# Patient Record
Sex: Female | Born: 1945
Health system: Southern US, Community
[De-identification: ages and names within clinical notes are randomized; demographics above are authoritative.]

## PROBLEM LIST (undated history)

## (undated) DIAGNOSIS — C539 Malignant neoplasm of cervix uteri, unspecified: Secondary | ICD-10-CM

## (undated) DIAGNOSIS — I10 Essential (primary) hypertension: Secondary | ICD-10-CM

## (undated) DIAGNOSIS — K219 Gastro-esophageal reflux disease without esophagitis: Secondary | ICD-10-CM

## (undated) DIAGNOSIS — H269 Unspecified cataract: Secondary | ICD-10-CM

## (undated) DIAGNOSIS — M779 Enthesopathy, unspecified: Secondary | ICD-10-CM

## (undated) DIAGNOSIS — N950 Postmenopausal bleeding: Secondary | ICD-10-CM

## (undated) HISTORY — DX: Enthesopathy, unspecified: M77.9

## (undated) HISTORY — DX: Malignant neoplasm of cervix uteri, unspecified: C53.9

## (undated) HISTORY — PX: EYE SURGERY: SHX253

## (undated) HISTORY — DX: Unspecified cataract: H26.9

---

## 1997-10-29 ENCOUNTER — Ambulatory Visit (HOSPITAL_COMMUNITY): Admission: RE | Admit: 1997-10-29 | Discharge: 1997-10-29 | Payer: Self-pay | Admitting: Obstetrics

## 1997-10-29 ENCOUNTER — Other Ambulatory Visit: Admission: RE | Admit: 1997-10-29 | Discharge: 1997-10-29 | Payer: Self-pay | Admitting: Obstetrics

## 1998-10-31 ENCOUNTER — Other Ambulatory Visit: Admission: RE | Admit: 1998-10-31 | Discharge: 1998-10-31 | Payer: Self-pay | Admitting: Obstetrics

## 1998-12-10 ENCOUNTER — Encounter: Payer: Self-pay | Admitting: Obstetrics

## 1998-12-10 ENCOUNTER — Ambulatory Visit (HOSPITAL_COMMUNITY): Admission: RE | Admit: 1998-12-10 | Discharge: 1998-12-10 | Payer: Self-pay | Admitting: Obstetrics

## 1998-12-18 ENCOUNTER — Ambulatory Visit (HOSPITAL_COMMUNITY): Admission: RE | Admit: 1998-12-18 | Discharge: 1998-12-18 | Payer: Self-pay | Admitting: Obstetrics

## 1998-12-18 ENCOUNTER — Encounter: Payer: Self-pay | Admitting: Obstetrics

## 1999-06-12 ENCOUNTER — Ambulatory Visit (HOSPITAL_COMMUNITY): Admission: RE | Admit: 1999-06-12 | Discharge: 1999-06-12 | Payer: Self-pay | Admitting: Obstetrics

## 1999-06-12 ENCOUNTER — Encounter: Payer: Self-pay | Admitting: Obstetrics

## 1999-12-01 ENCOUNTER — Other Ambulatory Visit: Admission: RE | Admit: 1999-12-01 | Discharge: 1999-12-01 | Payer: Self-pay | Admitting: Obstetrics

## 1999-12-04 ENCOUNTER — Encounter: Admission: RE | Admit: 1999-12-04 | Discharge: 1999-12-04 | Payer: Self-pay | Admitting: Obstetrics

## 1999-12-04 ENCOUNTER — Encounter: Payer: Self-pay | Admitting: Obstetrics

## 2001-06-02 ENCOUNTER — Encounter: Payer: Self-pay | Admitting: Obstetrics

## 2001-06-02 ENCOUNTER — Ambulatory Visit (HOSPITAL_COMMUNITY): Admission: RE | Admit: 2001-06-02 | Discharge: 2001-06-02 | Payer: Self-pay | Admitting: Obstetrics

## 2002-06-08 ENCOUNTER — Encounter: Payer: Self-pay | Admitting: Obstetrics

## 2002-06-08 ENCOUNTER — Ambulatory Visit (HOSPITAL_COMMUNITY): Admission: RE | Admit: 2002-06-08 | Discharge: 2002-06-08 | Payer: Self-pay | Admitting: Obstetrics

## 2003-06-13 ENCOUNTER — Ambulatory Visit (HOSPITAL_COMMUNITY): Admission: RE | Admit: 2003-06-13 | Discharge: 2003-06-13 | Payer: Self-pay | Admitting: Obstetrics

## 2004-06-24 ENCOUNTER — Ambulatory Visit (HOSPITAL_COMMUNITY): Admission: RE | Admit: 2004-06-24 | Discharge: 2004-06-24 | Payer: Self-pay | Admitting: Obstetrics

## 2004-08-03 HISTORY — PX: OTHER SURGICAL HISTORY: SHX169

## 2005-07-10 ENCOUNTER — Ambulatory Visit (HOSPITAL_COMMUNITY): Admission: RE | Admit: 2005-07-10 | Discharge: 2005-07-10 | Payer: Self-pay | Admitting: Obstetrics

## 2005-08-03 HISTORY — PX: COLONOSCOPY: SHX174

## 2006-05-04 ENCOUNTER — Encounter: Payer: Self-pay | Admitting: Internal Medicine

## 2006-07-12 ENCOUNTER — Ambulatory Visit (HOSPITAL_COMMUNITY): Admission: RE | Admit: 2006-07-12 | Discharge: 2006-07-12 | Payer: Self-pay | Admitting: Obstetrics

## 2006-07-23 ENCOUNTER — Encounter: Admission: RE | Admit: 2006-07-23 | Discharge: 2006-07-23 | Payer: Self-pay | Admitting: Obstetrics

## 2006-10-27 ENCOUNTER — Emergency Department (HOSPITAL_COMMUNITY): Admission: EM | Admit: 2006-10-27 | Discharge: 2006-10-27 | Payer: Self-pay | Admitting: *Deleted

## 2006-12-08 ENCOUNTER — Ambulatory Visit: Payer: Self-pay | Admitting: Internal Medicine

## 2007-07-04 ENCOUNTER — Ambulatory Visit: Payer: Self-pay | Admitting: Internal Medicine

## 2007-07-04 DIAGNOSIS — M898X9 Other specified disorders of bone, unspecified site: Secondary | ICD-10-CM | POA: Insufficient documentation

## 2007-07-04 DIAGNOSIS — I10 Essential (primary) hypertension: Secondary | ICD-10-CM | POA: Insufficient documentation

## 2007-07-04 DIAGNOSIS — R42 Dizziness and giddiness: Secondary | ICD-10-CM | POA: Insufficient documentation

## 2007-07-04 LAB — CONVERTED CEMR LAB
BUN: 8 mg/dL (ref 6–23)
CO2: 31 meq/L (ref 19–32)
Calcium: 9.5 mg/dL (ref 8.4–10.5)
GFR calc Af Amer: 82 mL/min
GFR calc non Af Amer: 68 mL/min
Glucose, Bld: 102 mg/dL — ABNORMAL HIGH (ref 70–99)
Potassium: 4.8 meq/L (ref 3.5–5.1)

## 2007-07-05 ENCOUNTER — Encounter: Payer: Self-pay | Admitting: Internal Medicine

## 2007-07-26 ENCOUNTER — Ambulatory Visit (HOSPITAL_COMMUNITY): Admission: RE | Admit: 2007-07-26 | Discharge: 2007-07-26 | Payer: Self-pay | Admitting: Internal Medicine

## 2007-07-26 ENCOUNTER — Encounter: Payer: Self-pay | Admitting: Internal Medicine

## 2008-02-14 ENCOUNTER — Ambulatory Visit: Payer: Self-pay | Admitting: Internal Medicine

## 2008-02-14 LAB — CONVERTED CEMR LAB
BUN: 16 mg/dL (ref 6–23)
Calcium: 9.7 mg/dL (ref 8.4–10.5)
GFR calc Af Amer: 82 mL/min
GFR calc non Af Amer: 68 mL/min
Glucose, Bld: 101 mg/dL — ABNORMAL HIGH (ref 70–99)
Sodium: 139 meq/L (ref 135–145)

## 2008-02-19 ENCOUNTER — Encounter: Payer: Self-pay | Admitting: Internal Medicine

## 2008-07-31 ENCOUNTER — Ambulatory Visit (HOSPITAL_COMMUNITY): Admission: RE | Admit: 2008-07-31 | Discharge: 2008-07-31 | Payer: Self-pay | Admitting: Internal Medicine

## 2009-04-29 ENCOUNTER — Ambulatory Visit: Payer: Self-pay | Admitting: Internal Medicine

## 2009-04-29 LAB — CONVERTED CEMR LAB
Basophils Relative: 0.3 % (ref 0.0–3.0)
CO2: 31 meq/L (ref 19–32)
Chloride: 105 meq/L (ref 96–112)
Eosinophils Relative: 1.6 % (ref 0.0–5.0)
HCT: 43.9 % (ref 36.0–46.0)
Hemoglobin: 14.2 g/dL (ref 12.0–15.0)
LDL Cholesterol: 110 mg/dL — ABNORMAL HIGH (ref 0–99)
Lymphs Abs: 2.9 10*3/uL (ref 0.7–4.0)
MCV: 94.2 fL (ref 78.0–100.0)
Monocytes Absolute: 0.7 10*3/uL (ref 0.1–1.0)
Neutro Abs: 1.4 10*3/uL (ref 1.4–7.7)
Neutrophils Relative %: 27.9 % — ABNORMAL LOW (ref 43.0–77.0)
Potassium: 4.9 meq/L (ref 3.5–5.1)
RBC: 4.66 M/uL (ref 3.87–5.11)
Total CHOL/HDL Ratio: 3
WBC: 5.1 10*3/uL (ref 4.5–10.5)

## 2009-08-01 ENCOUNTER — Ambulatory Visit (HOSPITAL_COMMUNITY): Admission: RE | Admit: 2009-08-01 | Discharge: 2009-08-01 | Payer: Self-pay | Admitting: Internal Medicine

## 2010-05-07 ENCOUNTER — Ambulatory Visit: Payer: Self-pay | Admitting: Internal Medicine

## 2010-05-07 ENCOUNTER — Encounter: Payer: Self-pay | Admitting: Internal Medicine

## 2010-05-07 LAB — CONVERTED CEMR LAB
BUN: 11 mg/dL (ref 6–23)
CO2: 31 meq/L (ref 19–32)
Calcium: 10.2 mg/dL (ref 8.4–10.5)
Glucose, Bld: 116 mg/dL — ABNORMAL HIGH (ref 70–99)
Sodium: 142 meq/L (ref 135–145)

## 2010-08-05 ENCOUNTER — Ambulatory Visit (HOSPITAL_COMMUNITY)
Admission: RE | Admit: 2010-08-05 | Discharge: 2010-08-05 | Payer: Self-pay | Source: Home / Self Care | Attending: Internal Medicine | Admitting: Internal Medicine

## 2010-08-24 ENCOUNTER — Encounter: Payer: Self-pay | Admitting: Obstetrics

## 2010-09-04 NOTE — Procedures (Signed)
Summary: Colonoscopy/[facility]  Colonoscopy/[facility]   Imported By: Sherian Rein 05/08/2010 11:37:48  _____________________________________________________________________  External Attachment:    Type:   Image     Comment:   External Document

## 2010-09-04 NOTE — Assessment & Plan Note (Signed)
Summary: FU--STC   Vital Signs:  Patient profile:   65 year old female Height:      65 inches Weight:      204 pounds BMI:     34.07 O2 Sat:      98 % on Room air Temp:     97.6 degrees F oral Pulse rate:   64 / minute BP sitting:   132 / 82  (left arm)  Vitals Entered By: Bill Salinas CMA (May 07, 2010 11:05 AM)  O2 Flow:  Room air CC: cpx/ab   Primary Care Provider:  Norins  CC:  cpx/ab.  History of Present Illness: Patient presents for routine medical exam. She has medically, no surgeries and no injury. She does report that things have been difficult but doesn't elaborate. In regard to her health she has been doing well.  Current Medications (verified): 1)  Hydrochlorothiazide 12.5 Mg  Tabs (Hydrochlorothiazide) .... Once Daily 2)  Multivitamins   Tabs (Multiple Vitamin) .... Once Daily 3)  Fish Oil 1000 Mg Caps (Omega-3 Fatty Acids) .Marland Kitchen.. 1 Capsule Once Daily 4)  Advil 200 Mg Tabs (Ibuprofen) .Marland Kitchen.. 1 Tablet Once Daily As Needed  Allergies (verified): 1)  Codeine  Past History:  Past Medical History: Last updated: 07-30-2007 Hypertension  Family History: Last updated: 30-Jul-2007 father- died 25, lung cancer mother - 71 (38), CAD, PVD, HTN, Lipid 3 Brother- HTN in one, 3 sisters - healthy Neg-breast cancer, DM Maternal uncles with colon cancer  Social History: Last updated: Jul 30, 2007 [employer]. married 1987, 1 yr-divorced;remarried 2003 1 son 74 work: Medical illustrator.  Past Surgical History: BONE SPUR (ICD-726.91)-left foot  '06  G1P1  Review of Systems       The patient complains of weight loss.  The patient denies fever, weight gain, decreased hearing, hoarseness, chest pain, syncope, dyspnea on exertion, peripheral edema, prolonged cough, headaches, hemoptysis, abdominal pain, severe indigestion/heartburn, incontinence, suspicious skin lesions, transient blindness, difficulty walking, depression, unusual weight change,  abnormal bleeding, enlarged lymph nodes, and angioedema.    Physical Exam  General:  Heavyset AA woman in no distress. Head:  Normocephalic and atraumatic without obvious abnormalities. No apparent alopecia or balding. Eyes:  vision grossly intact, pupils equal, pupils round, corneas and lenses clear, no iris abnormalities, no optic disk abnormalities, and no retinal abnormalitiies.   Ears:  External ear exam shows no significant lesions or deformities.  Otoscopic examination reveals clear canals, tympanic membranes are intact bilaterally without bulging, retraction, inflammation or discharge. Hearing is grossly normal bilaterally. Nose:  no external deformity and no external erythema.   Mouth:  missing teeth. no buccal or palatal lesions, throat clear Neck:  supple, no masses, no thyromegaly, and no carotid bruits.   Chest Wall:  No deformities, masses, or tenderness noted. Breasts:  deferred to gyn Lungs:  Normal respiratory effort, chest expands symmetrically. Lungs are clear to auscultation, no crackles or wheezes. Heart:  Normal rate and regular rhythm. S1 and S2 normal without gallop, murmur, click, rub or other extra sounds. Abdomen:  soft, non-tender, normal bowel sounds, no guarding, and no hepatomegaly.   Genitalia:  deferred to gyn Msk:  normal ROM, no joint tenderness, no joint swelling, and no redness over joints.   Pulses:  2+ radial and DP pulses Extremities:  No clubbing, cyanosis, edema, or deformity noted with normal full range of motion of all joints.   Neurologic:  alert & oriented X3, cranial nerves II-XII intact, strength normal in all extremities, gait normal,  and DTRs symmetrical and normal.   Skin:  turgor normal, color normal, and no suspicious lesions.   Cervical Nodes:  no anterior cervical adenopathy and no posterior cervical adenopathy.   Psych:  Oriented X3, normally interactive, and good eye contact.     Impression & Recommendations:  Problem # 1:   HYPERTENSION (ICD-401.9)  Her updated medication list for this problem includes:    Hydrochlorothiazide 12.5 Mg Tabs (Hydrochlorothiazide) ..... Once daily  Orders: TLB-BMP (Basic Metabolic Panel-BMET) (80048-METABOL)  BP today: 132/82 Prior BP: 138/84 (04/29/2009)  Good control on diuretic alone.  Plan - continue present medication. Bmet  Problem # 2:  Preventive Health Care (ICD-V70.0) Unremarkable medical historyr in interval since last visit. Limited physical exam is normal and she reports normal gyn exam. Reviewed previous lipid panel which was normal and does not need to be repeated. Current metabolic panel normal except for borderline elevation in blood sugar at 166 (but non-fasting state). 12 Lead EKG reveals normal sinus bradycardia She is current with mammography; last colonoscopy Oct. '07. Patient reports she has had tetnus immunization in the last 10 years and she takes the flu shot at work. She is offered but declines pneumonia vaccine or shingles vaccine.   In summary - a healthy appearing woman. She will return in 1 year or as needed.   Complete Medication List: 1)  Hydrochlorothiazide 12.5 Mg Tabs (Hydrochlorothiazide) .... Once daily 2)  Multivitamins Tabs (Multiple vitamin) .... Once daily 3)  Fish Oil 1000 Mg Caps (Omega-3 fatty acids) .Marland Kitchen.. 1 capsule once daily 4)  Advil 200 Mg Tabs (Ibuprofen) .Marland Kitchen.. 1 tablet once daily as needed  Other Orders: EKG w/ Interpretation (93000)  Tests: (1) BMP (METABOL)   Sodium                    142 mEq/L                   135-145   Potassium            [H]  5.8 mEq/L                   3.5-5.1   Chloride                  106 mEq/L                   96-112   Carbon Dioxide            31 mEq/L                    19-32   Glucose              [H]  116 mg/dL                   16-10   BUN                       11 mg/dL                    9-60   Creatinine                0.9 mg/dL                   4.5-4.0   Calcium                    10.2 mg/dL  8.4-10.5   GFR                       81.22 mL/min                >60Prescriptions: HYDROCHLOROTHIAZIDE 12.5 MG  TABS (HYDROCHLOROTHIAZIDE) once daily  #100 x 3   Entered and Authorized by:   Jacques Navy MD   Signed by:   Jacques Navy MD on 05/07/2010   Method used:   Electronically to        [facility] (retail)       [address]       [place], [place]  [address]       Ph: [phone]       Fax: [phone]   RxID:   [id number]    Preventive Care Screening  Colonoscopy:    Date:  05/04/2006    Next Due:  05/2016    Results:  normal

## 2010-12-19 NOTE — Assessment & Plan Note (Signed)
[facility]                           PRIMARY CARE OFFICE NOTE   NAME:[name], [name]                        MRN:          [id number]  DATE:12/08/2006                            DOB:          08-Aug-1945    Ms. [name] is a 65 year old African American who was referred by her  gynecologist, Dr. Gaynell Face, to establish for ongoing continuity care  with a medical physician.   The patient reports that she was seen in the [facility] emergency  department on 10/27/2006 for hematochezia. She denied having eaten beets  the night before. She was see by Dr. Faylene Kurtz. In reviewing EDMD  dictated report, the physical exam was unremarkable. The rectal exam  revealed no stool in the rectum, guag negative, red fluid on the exam  glove that was guag negative. The patient did have a laboratory  revealing a hemoglobin of 14 grams. Her white count was 3500 with a  normal differential. The patient was noted to be hypertensive at the  time of that exam and was started on hydrochlorothiazide 25 mg daily.  She has had no recurrent problems.   PAST MEDICAL HISTORY:   SURGICAL:  Operation for a bone spur in 2006. No other surgeries.   MEDICAL:  1. The patient had the usual childhood diseases.  2. Hypertensive blood pressure readings. No other medical problems      noted.   GYNECOLOGIC:  Gravida 1, para 1.   CURRENT MEDICATIONS:  None, having run out of hydrochlorothiazide 25 mg  several weeks ago. She does take Centrum silver.   FAMILY HISTORY:  Maternal uncle with colon cancer. Her father was a  smoker and died of lung cancer. Mother had hypertension. Grandmother had  diabetes. No history of breast cancer in the family. Cardiac disease,  hyperlipidemia.   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 The patient was married for one  year and then separated. She was single for 15 years and has  been  married for 4 years to her present husband. She has one son aged 101. She  has no grandchildren. She and her husband live alone and independently.  She enjoys her work.   HABITS:  The patient does not use tobacco products. She does use  alcohol, averaging 1 or 2 beers per month.   HEALTH MAINTENANCE:  The patient's last pelvic and pap smear dated July  2007. Her last mammogram was in 2007.   REVIEW OF SYSTEMS:  The patient had no constitutional problems except  for a mild weight gain. She had her last eye exam in 2007. The patient  does report that she does have an infected tooth. She does have  partials, upper and lower. There are no cardiovascular or respiratory  complaints. She has rare heartburn and does not require medication. She  reports that she had a colonoscopy by Dr. Sharon Mt, who I believe is  with [facility]. She had no GU, musculoskeletal, or dermatologic complaints.   LIMITED EXAM:  VITAL SIGNS:  Temperature 97.3, blood pressure 138/92,  pulse 64, weight 190.  GENERAL:  This is a well developed, well nourished woman in no acute  distress.  HEENT:  Normocephalic atraumatic. External auditory canals and tympanic  membranes were normal. Oral pharynx revealed the patient to have partial  dentures in place. No buckle or pallow lesions were noted. Posterior  pharynx was clear. Conjunctiva and sclerae was clear. Pupils equal,  round, and reactive to light and accommodation.  FUNDUSCOPIC:  Revealed normal disc margins. No vascular abnormalities  were noted.  NECK:  Supple. There was no thyromegaly. No adenopathy was noted in the  cervical supraclavicular regions.  CHEST:  No CVA tenderness.  LUNGS:  Clear to auscultation and percussion.  CARDIOVASCULAR:  2+ radial pulse. No JVD or carotid bruits. She had a  quiet precordium with a regular rate and rhythm without murmurs, rubs,  or gallops.  ABDOMEN:  Soft. No guarding or rebound. No organo splenomegaly was  noted.   PELVIC:  Referred to Dr. Gaynell Face.   The patient did have recent CBC at [facility] emergency department as  noted. The patient's last set of full labs dates from June 2006 where  she had normal electrolytes. Her blood sugar was normal at 87. Lipid  profile revealed a cholesterol of 184. Triglycerides 45. HDL was  excellent at 68. LDL was normal at 107.   ASSESSMENT AND PLAN:  1. Hypertension. The patient with borderline blood pressure at this      time. She reports that on hydrochlorothiazide her pressure was in      the 120/80 range. PLAN:  The patient is to continue      hydrochlorothiazide at a reduced dose of 12.5 mg daily.      Prescription for 100 25 mg tablets provided. The patient was      advised to pay cash.  2. Health maintenance. The patient is current with mammography. She is      current with colonoscopy and she does see Dr. Gaynell Face on a regular      basis.   In summary, this is a very pleasant patient now established for ongoing  continuity care. I have asked her to return to see me in six months for  follow up of her blood pressure or sooner on an as needed basis.     Rosalyn Gess Norins, MD  Electronically Signed    MEN/MedQ  DD: 12/08/2006  DT: 12/09/2006  Job #: [id number]   cc:   Bess Harvest, M.D.

## 2011-02-11 ENCOUNTER — Other Ambulatory Visit: Payer: Self-pay

## 2011-02-11 ENCOUNTER — Encounter (HOSPITAL_COMMUNITY): Payer: Self-pay

## 2011-02-11 ENCOUNTER — Encounter (HOSPITAL_COMMUNITY)
Admission: RE | Admit: 2011-02-11 | Discharge: 2011-02-11 | Disposition: A | Discharge status: Home / Self Care | Payer: BC Managed Care – PPO | Source: Ambulatory Visit | Attending: Obstetrics | Admitting: Obstetrics

## 2011-02-11 ENCOUNTER — Other Ambulatory Visit (HOSPITAL_COMMUNITY): Payer: Self-pay

## 2011-02-11 HISTORY — DX: Gastro-esophageal reflux disease without esophagitis: K21.9

## 2011-02-11 HISTORY — DX: Essential (primary) hypertension: I10

## 2011-02-11 LAB — CBC
Hemoglobin: 14.4 g/dL (ref 12.0–15.0)
MCH: 30.2 pg (ref 26.0–34.0)
MCHC: 33.5 g/dL (ref 30.0–36.0)

## 2011-02-11 LAB — BASIC METABOLIC PANEL
BUN: 11 mg/dL (ref 6–23)
Calcium: 9.9 mg/dL (ref 8.4–10.5)
GFR calc non Af Amer: >60 mL/min (ref >60)
Glucose, Bld: 97 mg/dL (ref 70–99)
Sodium: 136 mEq/L (ref 135–145)

## 2011-02-11 NOTE — Anesthesia Preprocedure Evaluation (Addendum)
Anesthesia Evaluation  Name, MR# and DOB Patient awake  General Assessment Comment  Reviewed: Allergy & Precautions, H&P  and Patient's Chart, lab work & pertinent test results  Airway Mallampati: III TM Distance: >3 FB Neck ROM: Full    Dental No notable dental hx (+) Lower Dentures and Upper Dentures   Pulmonaryneg pulmonary ROS      pulmonary exam normal   Cardiovascular Regular Normal   Neuro/PsychNegative Neurological ROS   GI/Hepatic/Renal negative GI ROS, negative Liver ROS, and negative Renal ROS (+)       Endo/Other  Negative Endocrine ROS (+)  Morbid obesity Abdominal Normal abdominal exam  (+)   Musculoskeletal negative musculoskeletal ROS (+)  Hematology negative hematology ROS (+)   Peds  Reproductive/Obstetrics   Anesthesia Other Findings             Anesthesia Physical Anesthesia Plan  ASA: III  Anesthesia Plan: General   Post-op Pain Management:    Induction: Intravenous  Airway Management Planned: LMA  Additional Equipment:   Intra-op Plan:   Post-operative Plan:   Informed Consent: I have reviewed the patients History and Physical, chart, labs and discussed the procedure including the risks, benefits and alternatives for the proposed anesthesia with the patient or authorized representative who has indicated his/her understanding and acceptance.     Plan Discussed with: Anesthesiologist (AP)  Anesthesia Plan Comments:        Anesthesia Quick Evaluation

## 2011-02-11 NOTE — Patient Instructions (Addendum)
20 MALENI SEYER  02/11/2011   Your procedure is scheduled on:  02/18/2011   Report to [facility] at 0915 AM.  Call this number if you have problems the morning of surgery: [phone]   Remember:   Do not eat food:After Midnight.  Do not drink clear liquids: After Midnight.  Take these medicines the morning of surgery with A SIP OF WATER: Hydrochlorothiazide   Do not wear jewelry, make-up or nail polish.  Do not bring valuables to the hospital.  Contacts, dentures or bridgework may not be worn into surgery.  Leave suitcase in the car. After surgery it may be brought to your room.  For patients admitted to the hospital, checkout time is 11:00 AM the day of discharge.   Patients discharged the day of surgery will not be allowed to drive home.  Name and phone number of your driver: Fayrene Fearing  Special Instructions: Hibiclens bath the night before your surgery and the morning of your surgery   Please read over the following fact sheets that you were given: Pain Booklet, Surgical Site Infection Prevention and Anesthesia Post-op Instructions

## 2011-02-17 NOTE — H&P (Signed)
NAME:  Payette, Kamaiya                      ACCOUNT NO.:  MEDICAL RECORD NO.:  [id number]  LOCATION:                                 FACILITY:  PHYSICIAN:  Kathreen Cosier, M.D.DATE OF BIRTH:  1945-08-25  DATE OF ADMISSION: DATE OF DISCHARGE:                             HISTORY & PHYSICAL   PREOPERATIVE DIAGNOSIS:  Postmenopausal bleeding.  The patient is a 65 year old gravida 1, para 1-0-0-1.  Her last period was when she was 65 years old.  She had a colonoscopy in 2007, and the patient stated that approximately a week prior to her last visit on January 19, 2011, she had some vaginal bleeding, so she is scheduled for a hysteroscopy D and C.  PAST MEDICAL HISTORY:  Negative.  PAST SURGICAL HISTORY:  Negative.  SOCIAL HISTORY:  Negative.  SYSTEM REVIEW:  Negative.  PHYSICAL EXAMINATION:  GENERAL:  Well-developed female, in no distress. HEENT:  Negative. LUNGS:  Clear. HEART:  Regular rhythm.  No murmurs, no gallops. BREASTS:  No masses. ABDOMEN:  Nontender. PELVIC:  Uterus was small.  Negative adnexa.  Pap smear was done. Vagina and external genitalia normal.          ______________________________ Kathreen Cosier, M.D.     BAM/MEDQ  D:  02/16/2011  T:  02/16/2011  Job:  [id number]

## 2011-02-18 ENCOUNTER — Ambulatory Visit (HOSPITAL_COMMUNITY)
Admission: RE | Admit: 2011-02-18 | Discharge: 2011-02-18 | Disposition: A | Discharge status: Home / Self Care | Payer: BC Managed Care – PPO | Source: Ambulatory Visit | Attending: Obstetrics | Admitting: Obstetrics

## 2011-02-18 ENCOUNTER — Ambulatory Visit (HOSPITAL_COMMUNITY): Payer: BC Managed Care – PPO | Admitting: Anesthesiology

## 2011-02-18 ENCOUNTER — Encounter (HOSPITAL_COMMUNITY): Payer: Self-pay | Admitting: Anesthesiology

## 2011-02-18 ENCOUNTER — Other Ambulatory Visit: Payer: Self-pay | Admitting: Obstetrics

## 2011-02-18 ENCOUNTER — Encounter (HOSPITAL_COMMUNITY): Payer: Self-pay | Admitting: Obstetrics

## 2011-02-18 ENCOUNTER — Encounter (HOSPITAL_COMMUNITY)
Admission: RE | Disposition: A | Discharge status: Home / Self Care | Payer: Self-pay | Source: Ambulatory Visit | Attending: Obstetrics

## 2011-02-18 DIAGNOSIS — Z01818 Encounter for other preprocedural examination: Secondary | ICD-10-CM | POA: Insufficient documentation

## 2011-02-18 DIAGNOSIS — N950 Postmenopausal bleeding: Secondary | ICD-10-CM

## 2011-02-18 DIAGNOSIS — Z01812 Encounter for preprocedural laboratory examination: Secondary | ICD-10-CM | POA: Insufficient documentation

## 2011-02-18 HISTORY — DX: Postmenopausal bleeding: N95.0

## 2011-02-18 HISTORY — PX: HYSTEROSCOPY WITH D & C: SHX1775

## 2011-02-18 SURGERY — DILATATION AND CURETTAGE /HYSTEROSCOPY
Anesthesia: General

## 2011-02-18 MED ORDER — FENTANYL CITRATE 0.05 MG/ML IJ SOLN
INTRAMUSCULAR | Status: DC | PRN
  Administered 2011-02-18: 100 ug via INTRAVENOUS

## 2011-02-18 MED ORDER — LIDOCAINE HCL (CARDIAC) 20 MG/ML IV SOLN
INTRAVENOUS | Status: DC | PRN
  Administered 2011-02-18: 80 mg via INTRAVENOUS

## 2011-02-18 MED ORDER — MEPERIDINE HCL 25 MG/ML IJ SOLN: 6.2500 mg | INTRAMUSCULAR | Status: DC | PRN

## 2011-02-18 MED ORDER — DEXAMETHASONE SODIUM PHOSPHATE 10 MG/ML IJ SOLN
INTRAMUSCULAR | Status: AC
  Filled 2011-02-18: qty 1

## 2011-02-18 MED ORDER — FENTANYL CITRATE 0.05 MG/ML IJ SOLN: 25.0000 ug | INTRAMUSCULAR | Status: DC | PRN

## 2011-02-18 MED ORDER — PROPOFOL 10 MG/ML IV EMUL
INTRAVENOUS | Status: AC
  Filled 2011-02-18: qty 20

## 2011-02-18 MED ORDER — ONDANSETRON HCL 4 MG/2ML IJ SOLN
INTRAMUSCULAR | Status: DC | PRN
  Administered 2011-02-18: 4 mg via INTRAVENOUS

## 2011-02-18 MED ORDER — ONDANSETRON HCL 4 MG/2ML IJ SOLN: 4.0000 mg | Freq: Once | INTRAMUSCULAR | Status: DC | PRN

## 2011-02-18 MED ORDER — DEXAMETHASONE SODIUM PHOSPHATE 4 MG/ML IJ SOLN
INTRAMUSCULAR | Status: DC | PRN
  Administered 2011-02-18: 10 mg via INTRAVENOUS

## 2011-02-18 MED ORDER — ACETAMINOPHEN 325 MG PO TABS: 325.0000 mg | ORAL_TABLET | ORAL | Status: DC | PRN

## 2011-02-18 MED ORDER — LACTATED RINGERS IV SOLN
INTRAVENOUS | Status: DC
  Administered 2011-02-18 (×3): via INTRAVENOUS

## 2011-02-18 MED ORDER — FENTANYL CITRATE 0.05 MG/ML IJ SOLN
INTRAMUSCULAR | Status: AC
  Filled 2011-02-18: qty 2

## 2011-02-18 MED ORDER — LIDOCAINE HCL 1 % IJ SOLN
INTRAMUSCULAR | Status: DC | PRN
  Administered 2011-02-18: 9 mL

## 2011-02-18 MED ORDER — MIDAZOLAM HCL 5 MG/5ML IJ SOLN
INTRAMUSCULAR | Status: DC | PRN
  Administered 2011-02-18: 2 mg via INTRAVENOUS

## 2011-02-18 MED ORDER — MIDAZOLAM HCL 2 MG/2ML IJ SOLN
INTRAMUSCULAR | Status: AC
  Filled 2011-02-18: qty 2

## 2011-02-18 MED ORDER — LIDOCAINE HCL (CARDIAC) 20 MG/ML IV SOLN
INTRAVENOUS | Status: AC
Start: 2011-02-18 — End: 2011-02-18
  Filled 2011-02-18: qty 5

## 2011-02-18 MED ORDER — GLYCINE 1.5 % IR SOLN
Status: DC | PRN
  Administered 2011-02-18: 3000 mL

## 2011-02-18 MED ORDER — PROPOFOL 10 MG/ML IV EMUL
INTRAVENOUS | Status: DC | PRN
  Administered 2011-02-18: 200 mg via INTRAVENOUS

## 2011-02-18 MED ORDER — KETOROLAC TROMETHAMINE 30 MG/ML IJ SOLN
INTRAMUSCULAR | Status: DC | PRN
  Administered 2011-02-18: 30 mg via INTRAVENOUS

## 2011-02-18 MED ORDER — ONDANSETRON HCL 4 MG/2ML IJ SOLN
INTRAMUSCULAR | Status: AC
  Filled 2011-02-18: qty 2

## 2011-02-18 MED ORDER — KETOROLAC TROMETHAMINE 30 MG/ML IJ SOLN
INTRAMUSCULAR | Status: AC
  Filled 2011-02-18: qty 1

## 2011-02-18 SURGICAL SUPPLY — 16 items
CANISTER SUCTION 2500CC (MISCELLANEOUS) ×2 IMPLANT
CATH ROBINSON RED A/P 16FR (CATHETERS) ×2 IMPLANT
CLOTH BEACON ORANGE TIMEOUT ST (SAFETY) ×2 IMPLANT
CONTAINER PREFILL 10% NBF 60ML (FORM) ×4 IMPLANT
DRAPE UTILITY XL STRL (DRAPES) ×2 IMPLANT
ELECT REM PT RETURN 9FT ADLT (ELECTROSURGICAL)
ELECTRODE REM PT RTRN 9FT ADLT (ELECTROSURGICAL) IMPLANT
ELECTRODE ROLLER BARREL 22FR (ELECTROSURGICAL) IMPLANT
ELECTRODE VAPORCUT 22FR (ELECTROSURGICAL) IMPLANT
GLOVE BIO SURGEON STRL SZ8.5 (GLOVE) ×4 IMPLANT
GOWN BRE IMP SLV AUR LG STRL (GOWN DISPOSABLE) ×2 IMPLANT
GOWN STRL REIN 2XL LVL4 (GOWN DISPOSABLE) ×2 IMPLANT
LOOP ANGLED CUTTING 22FR (CUTTING LOOP) IMPLANT
PACK HYSTEROSCOPY LF (CUSTOM PROCEDURE TRAY) ×2 IMPLANT
TOWEL OR 17X24 6PK STRL BLUE (TOWEL DISPOSABLE) ×4 IMPLANT
WATER STERILE IRR 1000ML POUR (IV SOLUTION) ×2 IMPLANT

## 2011-02-18 NOTE — Transfer of Care (Signed)
Immediate Anesthesia Transfer of Care Note  Patient: [name]  Procedure(s) Performed:  DILATATION AND CURETTAGE (D&C) /HYSTEROSCOPY  Patient Location: PACU  Anesthesia Type: General  Level of Consciousness: awake, alert  and oriented  Airway & Oxygen Therapy: Patient Spontanous Breathing and Patient connected to nasal cannula oxygen  Post-op Assessment: Report given to PACU RN and Post -op Vital signs reviewed and stable  Post vital signs: Reviewed and stable  Complications: No apparent anesthesia complications

## 2011-02-18 NOTE — Brief Op Note (Signed)
02/18/2011  11:30 AM  PATIENT:  [name]  65 y.o. female  PRE-OPERATIVE DIAGNOSIS:  post menopausal bleeding  POST-OPERATIVE DIAGNOSIS:  post menopausal bleeding  PROCEDURE:  Procedure(s): DILATATION AND CURETTAGE (D&C) /HYSTEROSCOPY  SURGEON:  Surgeon(s): Kathreen Cosier  PHYSICIAN ASSISTANT:   ASSISTANTS: none   ANESTHESIA:   general  ESTIMATED BLOOD LOSS: * No blood loss amount entered *   BLOOD ADMINISTERED:none  DRAINS: none   LOCAL MEDICATIONS USED:  XYLOCAINE 9 CC  SPECIMEN:  Source of Specimen:  endometrial tissue  DISPOSITION OF SPECIMEN:  PATHOLOGY  COUNTS:  YES  TOURNIQUET:  * No tourniquets in log *  DICTATION #:   PLAN OF CARE: home  PATIENT DISPOSITION:  PACU - hemodynamically stable.   Delay start of Pharmacological VTE agent (>24hrs) due to surgical blood loss or risk of bleeding:  not applicable

## 2011-02-18 NOTE — Op Note (Signed)
This is Dr. Francoise Ceo dictating the operative note on [name] e preop diagnosis postmenopausal bleeding Postop diagnosis is same Anesthesia Gen. Procedure The patient was placed in lithotomy position perineum and vagina prepped and draped Data entered with a straight catheter Bimanual exam revealed that the uterus is normal size negative adnexa Speculum placed in the vagina and the cervix visualized The cervix was injected with 9 cc of 1% Xylocaine The endometrial cavity was sounded 10 cm The anterior lip of the cervix was grasped with a tenaculum The cervix was dilated to #25 [name] The hysteroscope was inserted and the cavity was noted to be normal The hysteroscope was removed the fluid deficit was 90 cc A sharp curettage was performed and a large amount of tissue obtained and sent to pathology Patient tolerated the procedure well taken to the recovery in good condition end of dictation.

## 2011-02-18 NOTE — Anesthesia Postprocedure Evaluation (Signed)
  Anesthesia Post-op Note  Patient: [name]  Procedure(s) Performed:  DILATATION AND CURETTAGE (D&C) /HYSTEROSCOPY  Patient is awake and responsive. Pain and nausea are reasonably well controlled. Vital signs are stable and clinically acceptable. Oxygen saturation is clinically acceptable. There are no apparent anesthetic complications at this time. Patient is ready for discharge.

## 2011-02-23 NOTE — Addendum Note (Signed)
Addendum  created 02/23/11 0851 by Jiles Garter   Modules edited:Anesthesia Events

## 2011-03-10 ENCOUNTER — Encounter (HOSPITAL_COMMUNITY): Payer: Self-pay | Admitting: Obstetrics

## 2011-03-19 ENCOUNTER — Ambulatory Visit: Payer: BC Managed Care – PPO | Attending: Gynecologic Oncology | Admitting: Gynecologic Oncology

## 2011-03-19 DIAGNOSIS — Z801 Family history of malignant neoplasm of trachea, bronchus and lung: Secondary | ICD-10-CM | POA: Insufficient documentation

## 2011-03-19 DIAGNOSIS — Z79899 Other long term (current) drug therapy: Secondary | ICD-10-CM | POA: Insufficient documentation

## 2011-03-19 DIAGNOSIS — I10 Essential (primary) hypertension: Secondary | ICD-10-CM | POA: Insufficient documentation

## 2011-03-19 DIAGNOSIS — C549 Malignant neoplasm of corpus uteri, unspecified: Secondary | ICD-10-CM | POA: Insufficient documentation

## 2011-03-24 NOTE — Consult Note (Signed)
[name], GREENSPAN NO.:  [id number]  MEDICAL RECORD NO.:  [id number]  LOCATION:  GYN                          FACILITY:  [facility]  PHYSICIAN:  Laurette Schimke, MD     DATE OF BIRTH:  07/04/1946  DATE OF CONSULTATION:  03/19/2011 DATE OF DISCHARGE:                                CONSULTATION   REASON FOR EVALUATION:  Endometrial cancer.  HISTORY OF PRESENT ILLNESS:  [name] is a 65 year old gravida 1, para 1, last normal menstrual period in her 59s.  The patient was in her usual state of great health until she noted vaginal bleeding in June, she presented and a D and C was performed by Dr. Gaynell Face on February 18, 2011.  Pathology was notable for an endometrioid adenocarcinoma of grade 1.  PAST MEDICAL HISTORY:  Hypertension for 4 years.  PAST SURGICAL HISTORY:  Left foot bone spur surgery in 2006, D and C in 2012.  MEDICATIONS:  Centrum Silver daily, fish oil daily, Advil p.r.n., and unspecified diuretics 12.5 mg daily.  PAST GYN HISTORY:  Menarche occurred at age of 59 with menses occurring every month until menopause.  She reports diagnosis of leiomyoma premenopausal which were addressed when she became postmenopausal.  She reports OCP use for over 10 years and IUD for a brief period of time.  FAMILY HISTORY:  Father diagnosed with lung cancer at the age of 19, otherwise denies a family history of gynecologic, colon and GYN primary.  SOCIAL HISTORY:  [name] has been married for 9 years.  Her child is alive and well.  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 She denies tobacco use.  Reports occasional alcohol use.  SCREENING HISTORY:  Colonoscopy in 2007, Pap in 2012, and mammogram in January 2012; all within normal limits.  ALLERGIES:  CODEINE which causes a rash.  REVIEW OF SYSTEMS:  No nausea, vomiting, fever, or chills.  No shortness of breath, dyspnea, or chest pain.  No adenopathy.  Denies flank pain. Reports scant vaginal  bleeding since D and C.  No hematochezia or hematuria.  No lower extremity edema.  Otherwise, 10-point review of systems is negative.  PHYSICAL EXAMINATION:  GENERAL:  A well-developed female, in no acute distress. VITAL SIGNS:  Weight 191 pounds, height 5 feet 4 inches, blood pressure 110/68, pulse of 80, temperature 98.0, and BMI 32. LYMPH NODE:  No cervical, supraclavicular, or inguinal adenopathy. CHEST:  Clear to auscultation. HEART:  Regular rate and rhythm. BACK:  No CVA tenderness. ABDOMEN:  Soft, obese, and nontender.  No palpable masses.  No evidence of a fluid wave or hernia. PELVIC:  Normal external genitalia, Bartholin's, urethra, and Skene's. Scant vaginal bleeding.  The uterus mobile, unable to clearly assess size, palpated approximately 11 cm.  No cul-de-sac masses. RECTAL:  Good anal sphincter tone without any rectovaginal septum nodularity. EXTREMITIES:  No clubbing, cyanosis, or edema.  IMPRESSION:  [name] is a 65 year old with a grade 1 endometrial adenocarcinoma.  Options presented to the patient were that a surgical staging versus radiation or IUD use.  The patient opted for definitive treatment.  The plan is for robotic-assisted total  laparoscopic hysterectomy, bilateral salpingo-oophorectomy, and bilateral pelvic lymph node dissection on March 31, 2011.  Risks and benefits of the procedure were discussed with the patient and all of her questions were answered.     Laurette Schimke, MD     WB/MEDQ  D:  03/19/2011  T:  03/20/2011  Job:  [id number]  cc:   Dr.  Dorann Ou, R.N. [address] [place], [place] [address]  Electronically Signed by Laurette Schimke MD on 03/24/2011 07:07:40 AM

## 2011-03-27 ENCOUNTER — Other Ambulatory Visit: Payer: Self-pay | Admitting: Obstetrics & Gynecology

## 2011-03-27 ENCOUNTER — Other Ambulatory Visit: Payer: Self-pay | Admitting: Gynecologic Oncology

## 2011-03-27 ENCOUNTER — Encounter (HOSPITAL_COMMUNITY): Payer: BC Managed Care – PPO

## 2011-03-27 ENCOUNTER — Ambulatory Visit (HOSPITAL_COMMUNITY)
Admission: RE | Admit: 2011-03-27 | Discharge: 2011-03-27 | Disposition: A | Discharge status: Home / Self Care | Payer: BC Managed Care – PPO | Source: Ambulatory Visit | Attending: Obstetrics & Gynecology | Admitting: Obstetrics & Gynecology

## 2011-03-27 DIAGNOSIS — I10 Essential (primary) hypertension: Secondary | ICD-10-CM | POA: Insufficient documentation

## 2011-03-27 DIAGNOSIS — Z01812 Encounter for preprocedural laboratory examination: Secondary | ICD-10-CM | POA: Insufficient documentation

## 2011-03-27 DIAGNOSIS — Z01818 Encounter for other preprocedural examination: Secondary | ICD-10-CM | POA: Insufficient documentation

## 2011-03-27 DIAGNOSIS — Z01811 Encounter for preprocedural respiratory examination: Secondary | ICD-10-CM

## 2011-03-27 LAB — COMPREHENSIVE METABOLIC PANEL
AST: 21 U/L (ref 0–37)
BUN: 13 mg/dL (ref 6–23)
CO2: 29 mEq/L (ref 19–32)
Calcium: 10.1 mg/dL (ref 8.4–10.5)
Creatinine, Ser: 0.87 mg/dL (ref 0.50–1.10)
GFR calc Af Amer: >60 mL/min (ref >60)
GFR calc non Af Amer: >60 mL/min (ref >60)
Total Bilirubin: 0.3 mg/dL (ref 0.3–1.2)

## 2011-03-27 LAB — DIFFERENTIAL
Eosinophils Absolute: 0 10*3/uL (ref 0.0–0.7)
Eosinophils Relative: 1 % (ref 0–5)
Lymphocytes Relative: 55 % — ABNORMAL HIGH (ref 12–46)
Lymphs Abs: 2.7 10*3/uL (ref 0.7–4.0)
Monocytes Relative: 10 % (ref 3–12)
Neutrophils Relative %: 34 % — ABNORMAL LOW (ref 43–77)

## 2011-03-27 LAB — CBC
HCT: 40.4 % (ref 36.0–46.0)
MCH: 30.2 pg (ref 26.0–34.0)
MCV: 89.6 fL (ref 78.0–100.0)
Platelets: 175 10*3/uL (ref 150–400)
RBC: 4.51 MIL/uL (ref 3.87–5.11)

## 2011-03-27 LAB — ABO/RH: ABO/RH(D): A POS

## 2011-03-27 LAB — SURGICAL PCR SCREEN
MRSA, PCR: NEGATIVE
Staphylococcus aureus: NEGATIVE

## 2011-03-31 ENCOUNTER — Other Ambulatory Visit: Payer: Self-pay | Admitting: Gynecologic Oncology

## 2011-03-31 ENCOUNTER — Ambulatory Visit (HOSPITAL_COMMUNITY)
Admission: RE | Admit: 2011-03-31 | Discharge: 2011-04-01 | Disposition: A | Discharge status: Home / Self Care | Payer: BC Managed Care – PPO | Source: Ambulatory Visit | Attending: Obstetrics & Gynecology | Admitting: Obstetrics & Gynecology

## 2011-03-31 DIAGNOSIS — C549 Malignant neoplasm of corpus uteri, unspecified: Secondary | ICD-10-CM | POA: Insufficient documentation

## 2011-03-31 DIAGNOSIS — I10 Essential (primary) hypertension: Secondary | ICD-10-CM | POA: Insufficient documentation

## 2011-03-31 DIAGNOSIS — R7309 Other abnormal glucose: Secondary | ICD-10-CM | POA: Insufficient documentation

## 2011-03-31 HISTORY — PX: ROBOTIC ASSISTED LAP VAGINAL HYSTERECTOMY: SHX2362

## 2011-03-31 LAB — GLUCOSE, CAPILLARY
Glucose-Capillary: 98 mg/dL (ref 70–99)
Glucose-Capillary: 113 mg/dL — ABNORMAL HIGH (ref 70–99)

## 2011-03-31 LAB — TYPE AND SCREEN

## 2011-04-01 LAB — CBC
Platelets: 167 10*3/uL (ref 150–400)
RBC: 4.02 MIL/uL (ref 3.87–5.11)
RDW: 13.5 % (ref 11.5–15.5)
WBC: 6.1 10*3/uL (ref 4.0–10.5)

## 2011-04-03 NOTE — Op Note (Signed)
NAMECARMISHA, [name] NO.:  [id number]  MEDICAL RECORD NO.:  [id number]  LOCATION:  1536                         FACILITY:  [facility]  PHYSICIAN:  Laurette Schimke, MD     DATE OF BIRTH:  [birth year]  DATE OF PROCEDURE:  03/31/2011 DATE OF DISCHARGE:                              OPERATIVE REPORT   PREOPERATIVE DIAGNOSIS:  Grade 1 endometrial cancer.  POSTOPERATIVE DIAGNOSIS:  Stage IA, grade 1 endometrial cancer.  PROCEDURE:  Robotic-assisted laparoscopic hysterectomy, bilateral salpingo-oophorectomy, right pelvic lymph node dissection.  ANESTHESIA:  General endotracheal.  SURGEON:  Laurette Schimke, MD  ASSISTANT:  Roseanna Rainbow, MD; Telford Nab, RN  FINDINGS:  An 8 cm uterus, mobile; left broad ligament leiomyoma; and small cyst on the right ovary.  DESCRIPTION OF PROCEDURE:  The patient was taken to the operating room and placed under general endotracheal anesthesia without any difficulty. She was placed in dorsal lithotomy position and the abdomen and vagina prepped.  Uterine manipulator was inserted and the balloon was placed within the vagina.  An incision was made approximately 2 cm inferior to the left costal margin and under direct visualization, an Optiview scope was inserted into the upper abdominal cavity.  The abdomen was insufflated.  An incision was made just inferior to the umbilicus. Incisions were made 10 cm lateral to the umbilical incision and one 2 cm superior to the left anterior superior iliac spine.  Through these sites, trocars were placed under direct visualization.  The patient was placed in Trendelenburg and the robotic system was docked.  The right ureter was easily visible.  The round ligament was transected, retroperitoneal space entered.  The infundibulopelvic ligament skeletonized, cauterized, and transected.  The broad ligament was dissected free down to the level of the cervix, both anteriorly and posteriorly.   Bladder flap was created and the bladder dissected off the cervix.  The uterine vessels on the right were skeletonized, coagulated, and transected.  In a similar fashion, the left round ligament was transected, retroperitoneal space entered, the ureter identified.  The broad ligament incised.  The left infundibulopelvic vessels skeletonized, cauterized, transected.  The broad ligament similarly was transected anteriorly and posteriorly down to the cervix.  Left uterine vessels were skeletonized, cauterized, and transected.  Of note, throughout the entire procedure, the abdominal pressure did not exceed 15 mm.  In fact, after colpotomy incision, there was difficulty maintaining insufflation.  A colpotomy incision was made and the uterus, cervix, ovaries, and tubes delivered through the vagina.  The vaginal balloon was re-inserted into the vaginal cavity.  The right anterior vesical artery was identified. The vesicovaginal space created, the obturator nerve identified.  Nodal tissue was removed from between the landmarks of the obturator nerve inferiorly, the superior vesical artery medially, the genitofemoral nerve laterally, and the bifurcation of the common iliac artery. Cautery was used to remove the specimens overlying the external iliac artery and vein between the boundaries previously identified.  Frozen section returned with a grade 1 endometrial cancer with minimal myometrial invasion.  As such, there did not appear to be the need to complete the left pelvic lymph node dissection.  The abdomen was  irrigated and drained, hemostasis was assured.  Nodal specimen was placed within a bag and was removed from the left upper quadrant port. The vaginal cuff was closed with 0 Vicryl sutures overlapping in the midline.  The sutures were secured to each other.  Hemostasis was once again assured.  The robot was undocked and instruments removed and abdomen desufflated.  The port sites were  irrigated.  The vagina was swabbed. The umbilical fascia was closed with Vicryl.  All other port sites and subcuticular closures were facilitated with 4-0 Vicryl.  Steri- Strips were placed over the incision.  Sponge and needle count correct x3.  DRAINS:  Foley draining clear urine.  PATHOLOGY:  Grade 1 endometrial cancer with minimal invasion.  DISPOSITION:  The patient was extubated and taken to recovery room in good condition.     Laurette Schimke, MD     WB/MEDQ  D:  03/31/2011  T:  04/01/2011  Job:  [id number]  cc:   Roseanna Rainbow, M.D. Fax: [phone]  Telford Nab, R.N. [address] [place], [place] [address]  Kathreen Cosier, M.D. Fax: [phone]  Electronically Signed by Laurette Schimke MD on 04/03/2011 08:45:58 AM

## 2011-05-08 ENCOUNTER — Telehealth: Payer: Self-pay | Admitting: *Deleted

## 2011-05-08 NOTE — Telephone Encounter (Signed)
Patient requesting a call regarding RX

## 2011-05-08 NOTE — Telephone Encounter (Signed)
Left VM for patient to call office back.

## 2011-05-11 ENCOUNTER — Other Ambulatory Visit: Payer: Self-pay | Admitting: Internal Medicine

## 2011-05-12 MED ORDER — HYDROCHLOROTHIAZIDE 12.5 MG PO CAPS: 12.5000 mg | ORAL_CAPSULE | Freq: Every day | ORAL | Status: DC

## 2011-05-14 ENCOUNTER — Ambulatory Visit: Payer: BC Managed Care – PPO | Attending: Gynecologic Oncology | Admitting: Gynecologic Oncology

## 2011-05-14 DIAGNOSIS — R232 Flushing: Secondary | ICD-10-CM | POA: Insufficient documentation

## 2011-05-14 DIAGNOSIS — Z90710 Acquired absence of both cervix and uterus: Secondary | ICD-10-CM | POA: Insufficient documentation

## 2011-05-14 DIAGNOSIS — Z79899 Other long term (current) drug therapy: Secondary | ICD-10-CM | POA: Insufficient documentation

## 2011-05-14 DIAGNOSIS — C549 Malignant neoplasm of corpus uteri, unspecified: Secondary | ICD-10-CM | POA: Insufficient documentation

## 2011-05-15 NOTE — Consult Note (Signed)
  NAMESHRUTHI, Fowler NO.:  [id number]  MEDICAL RECORD NO.:  [id number]  LOCATION:  GYN                          FACILITY:  [facility]  PHYSICIAN:  Laurette Schimke, MD     DATE OF BIRTH:  03/18/1946  DATE OF CONSULTATION: DATE OF DISCHARGE:                                CONSULTATION   REASON FOR VISIT:  Postoperative check, status post endometrial cancer staging.  HISTORY OF PRESENT ILLNESS:  This is a 65 year old, who presented to Dr. Gaynell Face with complaints of vaginal bleeding and an D and C on February 18, 2011, noted the presence of a grade 1 endometrioid adenocarcinoma.  On March 31, 2011, she underwent a robotic-assisted laparoscopic hysterectomy, bilateral salpingo-oophorectomy, and left pelvic lymph node dissection.  Final pathology is notable for a grade 1 lesion. There is 2 mm of invasion into the myometrium where it was 13 mm in thickness.  There is no cervical stromal involvement or lymphovascular space invasion.  Postoperatively, [name] complains of hot flashes and mood swings.  PAST MEDICAL HISTORY:  No interval changes.  PAST SURGICAL HISTORY:  Interval endometrial cancer staging in August 2012.  REVIEW OF SYSTEMS:  No nausea, vomiting, fever, chills, or abdominal pain.  No flank pain.  Incision sites are healing well.  No tenderness or discomfort.  Otherwise, 10-point review of systems is negative.  PHYSICAL EXAMINATION:  GENERAL:  Well-developed female in no acute distress. VITAL SIGNS:  Weight 181 pounds, blood pressure 130/70, pulse is 76, temperature 98.3. CHEST:  Clear to auscultation. HEART:  Regular rate and rhythm. ABDOMEN:  Soft, nontender, and nondistended.  Port sites are all healing well with the exception of the left lower quadrant site.  The sutures were removed and there is some residual tenderness in the area.  No evidence of a hernia. PELVIC EXAMINATION:  Vaginal cuff was noted for a small area of separation in the  midportion.  Silver nitrate was applied.  IMPRESSION: 1. Stage IA, grade 1 endometrioid, endometrial adenocarcinoma.  Ms.     Fowler has been advised to follow up with     Dr. Gaynell Face in 6 months and with the GYN Oncology Service in 12     months.  Annual Pap tests will be collected by Dr. Gaynell Face.     Imaging is only indicated has prompted by physical findings. 2. Hot flashes.  Effexor 37.5 mg twice daily for 2 weeks and then     subsequently daily have been prescribed.     Laurette Schimke, MD     WB/MEDQ  D:  05/14/2011  T:  05/15/2011  Job:  [id number]  cc:   Telford Nab, R.N. [address] [place], [place] [address]  Kathreen Cosier, M.D. Fax: [phone]  Electronically Signed by Laurette Schimke MD on 05/15/2011 12:07:16 PM

## 2011-06-03 ENCOUNTER — Other Ambulatory Visit: Payer: Self-pay | Admitting: Internal Medicine

## 2011-06-03 MED ORDER — HYDROCHLOROTHIAZIDE 12.5 MG PO CAPS: 12.5000 mg | ORAL_CAPSULE | Freq: Every day | ORAL | Status: DC

## 2011-06-03 NOTE — Telephone Encounter (Signed)
Pt called and is requesting refill of hydrochlorothiazide 12.5 mg , cpe scheduled for 12/10.

## 2011-07-06 ENCOUNTER — Other Ambulatory Visit: Payer: Self-pay | Admitting: Internal Medicine

## 2011-07-06 DIAGNOSIS — Z1231 Encounter for screening mammogram for malignant neoplasm of breast: Secondary | ICD-10-CM

## 2011-07-13 ENCOUNTER — Ambulatory Visit (INDEPENDENT_AMBULATORY_CARE_PROVIDER_SITE_OTHER): Payer: BC Managed Care – PPO | Admitting: Internal Medicine

## 2011-07-13 ENCOUNTER — Encounter: Payer: Self-pay | Admitting: Internal Medicine

## 2011-07-13 DIAGNOSIS — C541 Malignant neoplasm of endometrium: Secondary | ICD-10-CM

## 2011-07-13 DIAGNOSIS — I10 Essential (primary) hypertension: Secondary | ICD-10-CM

## 2011-07-13 DIAGNOSIS — C549 Malignant neoplasm of corpus uteri, unspecified: Secondary | ICD-10-CM

## 2011-07-13 DIAGNOSIS — Z0000 Encounter for general adult medical examination without abnormal findings: Secondary | ICD-10-CM

## 2011-07-13 MED ORDER — HYDROCHLOROTHIAZIDE 12.5 MG PO CAPS: 12.5000 mg | ORAL_CAPSULE | Freq: Every day | ORAL | Status: DC

## 2011-07-13 NOTE — Progress Notes (Signed)
Subjective:    Patient ID: [name], female    DOB: 1946/04/06, 65 y.o.   MRN: [id number]  HPI [name] presents for annual exam. In the interval she had metorrhagia which lead to a diagnosis of uterine cancer. She came to robotic assisted laproscopic hysterectomy Augst 28th with BSO. Reviewed the path report: Graqde 1A endometrial with myometrial invasion, but clean margins and 0/7 lymph nodes. She will be seeing gyn annually. [name] does report that she had some minor post-coital bleeding but she also has atrophic vaginitis. Post operatively she lost some weight.   Reviewed all hospital records: she had normal labs: Cmet, CBC. Last lipid panel in '10 with HDL 74.5, LDL 110. She has been diagnosed with bilateral cataracts - but can delay on repair. She does notice some visual change.   Past Medical History  Diagnosis Date  . Hypertension   . GERD (gastroesophageal reflux disease)     On no meds  . Post-menopausal bleeding 02/18/2011   Past Surgical History  Procedure Date  . Colonoscopy 2007  . Bone spur removed from left foot 2006  . Hysteroscopy w/d&c 02/18/2011    Procedure: DILATATION AND CURETTAGE (D&C) /HYSTEROSCOPY;  Surgeon: Sheri Cosier, MD;  Location: [facility];  Service: Gynecology;  Laterality: N/A;  . Robotic assisted lap vaginal hysterectomy 03/31/11    hyst with SBO due to endometrial cancer   Family History  Problem Relation Age of Onset  . Hyperlipidemia Mother   . Hypertension Mother   . Cancer Father 62    lung cancer  . Hypertension Brother    History   Social History  . Marital Status: Married    Spouse Name: N/A    Number of Children: N/A  . Years of Education: N/A   Occupational History  . Not on file.   Social History Main Topics  . Smoking status: Never Smoker   . Smokeless tobacco: Never Used  . Alcohol Use: 1.2 oz/week    2 Cans of beer per week  . Drug Use: No  . Sexually Active: Yes    Birth Control/ Protection:  Post-menopausal   Other Topics Concern  . Not on file   Social History Narrative   [place]; remarried [id number] son 1967Work: [employer].       Review of Systems Constitutional:  Negative for fever, chills, activity change and unexpected weight change. Minor night sweats HEENT:  Negative for hearing loss, ear pain, congestion, neck stiffness and postnasal drip. Negative for sore throat or swallowing problems. Negative for dental complaints.   Eyes: Negative for vision loss or change in visual acuity.  Respiratory: Negative for chest tightness and wheezing. Negative for DOE.   Cardiovascular: Negative for chest pain or palpitations. No decreased exercise tolerance Gastrointestinal: No change in bowel habit. No bloating or gas. No reflux or indigestion Genitourinary: Negative for urgency, frequency, flank pain and difficulty urinating.  Musculoskeletal: Negative for myalgias, back pain, arthralgias and gait problem.  Neurological: Negative for dizziness, tremors, weakness and headaches.  Hematological: Negative for adenopathy.  Psychiatric/Behavioral: Positive for behavioral problems and dysphoric mood after surgery-prescribed effexor ER 37.5 which she never took for fear of side effects.  .       Objective:   Physical Exam Vitals reviewed-excellent BP control. Gen'l: well nourished, well developed AA woman in no distress HEENT - [place]/AT, EACs/TMs normal, oropharynx with partial plates &  dentition in good condition, no buccal or  palatal lesions, posterior pharynx clear, mucous membranes moist. C&S clear, PERRLA, fundi - normal Neck - supple, no thyromegaly Nodes- negative submental, cervical, supraclavicular regions Chest - no deformity, no CVAT Lungs - cleat without rales, wheezes. No increased work of breathing Breast - deferred to gyn and normal mammogram Jan '12 Cardiovascular - regular rate and rhythm, quiet precordium, no  murmurs, rubs or gallops, 2+ radial, DP and PT pulses Abdomen - BS+ x 4, no HSM, no guarding or rebound or tenderness Pelvic - deferred to gyn Rectal - deferred to gyn Extremities - no clubbing, cyanosis, edema or deformity.  Neuro - A&O x 3, CN II-XII normal, motor strength normal and equal, DTRs 2+ and symmetrical biceps, radial, and patellar tendons. Cerebellar - no tremor, no rigidity, fluid movement and normal gait. Derm - Head, neck, back, abdomen and extremities without suspicious lesions        Assessment & Plan:

## 2011-07-14 DIAGNOSIS — Z8542 Personal history of malignant neoplasm of other parts of uterus: Secondary | ICD-10-CM | POA: Insufficient documentation

## 2011-07-14 DIAGNOSIS — Z0000 Encounter for general adult medical examination without abnormal findings: Secondary | ICD-10-CM | POA: Insufficient documentation

## 2011-07-14 NOTE — Assessment & Plan Note (Signed)
BP Readings from Last 3 Encounters:  07/13/11 118/78  02/18/11 155/81  02/18/11 155/81   Today's BP is good. Continue HCTZ daily

## 2011-07-14 NOTE — Assessment & Plan Note (Signed)
Interval history remarkable for endometrial cancer s/o hysterectomy and BSO. No adjuvant therapy has been needed. She has made a good recovery. In hospital labs were reviewed and are normal. She had a lipid panel in the past several years that was normal and does not need repeating this year. She is current with colorectal cancer screening and she is up to date with mammography. Immunizations: she is due for Tdap, Shingles and pneumonia vaccines  In summary- a very nice woman who has made a good recovery from her surgery. She is medically stable at this time and doing well. She will return in 12-18 months for follow-up exam, sooner as needed.

## 2011-07-14 NOTE — Assessment & Plan Note (Signed)
Patient has done well with a good recovery. She is reassured that post-coital spotting is almost certainly vaginal trauma in setting of atrophic vaginitis and not related to her cancer.   Plan - she will follow-up with gyn annually as instructed.

## 2011-08-10 ENCOUNTER — Ambulatory Visit (HOSPITAL_COMMUNITY)
Admission: RE | Admit: 2011-08-10 | Discharge: 2011-08-10 | Disposition: A | Discharge status: Home / Self Care | Payer: BC Managed Care – PPO | Source: Ambulatory Visit | Attending: Internal Medicine | Admitting: Internal Medicine

## 2011-08-10 DIAGNOSIS — Z1231 Encounter for screening mammogram for malignant neoplasm of breast: Secondary | ICD-10-CM | POA: Insufficient documentation

## 2012-03-03 ENCOUNTER — Other Ambulatory Visit: Payer: Self-pay | Admitting: Internal Medicine

## 2012-03-03 NOTE — Telephone Encounter (Signed)
Pt is req refill for HYDROCHLOROT 12.5MG  to be call into [facility] on [address]. Pt stated that she change her insurance from [employer] to [employer] complete(see copy if needed). Pt only got 3 pills left and pt is not coming in to see Dr. Debby Bud until 07/13/12. Please call pt once its done.

## 2012-03-04 MED ORDER — HYDROCHLOROTHIAZIDE 12.5 MG PO CAPS: 12.5000 mg | ORAL_CAPSULE | Freq: Every day | ORAL | Status: DC

## 2012-03-04 NOTE — Telephone Encounter (Signed)
Patient notified of med called to [facility] on [address]

## 2012-03-07 ENCOUNTER — Telehealth: Payer: Self-pay | Admitting: *Deleted

## 2012-03-07 DIAGNOSIS — C541 Malignant neoplasm of endometrium: Secondary | ICD-10-CM

## 2012-03-07 NOTE — Telephone Encounter (Signed)
PATIENT CALLED REQEUST REFERRAL BE SET UP FOR HER APPT. THAT IS DUE IN September WITH  DR. Francoise Ceo.

## 2012-03-08 NOTE — Telephone Encounter (Signed)
Should not need referral to gyn. She can schedule her own appointment

## 2012-03-09 NOTE — Telephone Encounter (Signed)
Spoke with patient about GYN appt.. States she was told by Dr. Gaynell Face nurse she had to have a referral for appt. Due to her being on Medicare. Please advise

## 2012-03-09 NOTE — Telephone Encounter (Signed)
To my knowledge that is not true but won't argue with Dr. Elsie Stain nurse. [facility] notified.

## 2012-04-08 ENCOUNTER — Encounter: Payer: Self-pay | Admitting: Obstetrics & Gynecology

## 2012-04-08 ENCOUNTER — Ambulatory Visit (INDEPENDENT_AMBULATORY_CARE_PROVIDER_SITE_OTHER): Payer: Medicare Other | Admitting: Obstetrics & Gynecology

## 2012-04-08 VITALS — BP 151/79 | HR 69 | Temp 97.0°F | Ht 64.5 in | Wt 188.2 lb

## 2012-04-08 DIAGNOSIS — Z08 Encounter for follow-up examination after completed treatment for malignant neoplasm: Secondary | ICD-10-CM

## 2012-04-08 DIAGNOSIS — Z8542 Personal history of malignant neoplasm of other parts of uterus: Secondary | ICD-10-CM

## 2012-04-08 DIAGNOSIS — Z09 Encounter for follow-up examination after completed treatment for conditions other than malignant neoplasm: Secondary | ICD-10-CM

## 2012-04-08 NOTE — Progress Notes (Signed)
Subjective:     Patient ID: [name], female   DOB: October 24, 1945, 66 y.o.   MRN: [id number]  HPI  Pt with h/o Endometrial CA treated with surgery 1 year prev.  Pt here for post cancer surveillance.  Pt with no complaints.  No vaginal bleeding.  Was on Effexor which she has stopped.  C/o some vaginal dryness which she use KY jelly for.  Past Medical History  Diagnosis Date  . Hypertension   . GERD (gastroesophageal reflux disease)     On no meds  . Post-menopausal bleeding 02/18/2011   Past Surgical History  Procedure Date  . Colonoscopy 2007  . Bone spur removed from left foot 2006  . Hysteroscopy w/d&c 02/18/2011    Procedure: DILATATION AND CURETTAGE (D&C) /HYSTEROSCOPY;  Surgeon: Kathreen Cosier, MD;  Location: [facility];  Service: Gynecology;  Laterality: N/A;  . Robotic assisted lap vaginal hysterectomy 03/31/11    hyst with SBO due to endometrial cancer   Current Outpatient Prescriptions on File Prior to Visit  Medication Sig Dispense Refill  . Fish Oil OIL Take 1 tablet by mouth daily. [employer] chewables       . hydrochlorothiazide (MICROZIDE) 12.5 MG capsule Take 1 capsule (12.5 mg total) by mouth daily.  90 capsule  3  . ibuprofen (ADVIL,MOTRIN) 200 MG tablet Take 200 mg by mouth 2 (two) times daily. As needed for pain       . Multiple Vitamins-Minerals (CENTRUM SILVER ULTRA WOMENS PO) Take 1 tablet by mouth every morning.        . Polyethylene Glycol 3350 (MIRALAX PO) Take 15 mLs by mouth daily. With 6-8 oz glass of water         Review of Systems    n/c  Objective:   Physical Exam BP 151/79  Pulse 69  Temp 97 F (36.1 C)  Ht 5' 4.5" (1.638 m)  Wt 188 lb 3.2 oz (85.367 kg)  BMI 31.81 kg/m2  Lungs:  CTA CV: RRR Abd: soft, NT, ND GU: EGBUS: no lesions Vagina: no blood in vault Cuff without masses or tenderness. Uterus: surgically absent Adnexa: no masses; non tender        Assessment:     H/o adeno Ca of the cervix.  Pt for 6 month f/u with no  problems Consult note from oncologist reviewed    Plan:     F/u 6 months Pap in 6 months and annually thereafter  Darel Ricketts L. Harraway-Smith, M.D., [place]

## 2012-04-08 NOTE — Patient Instructions (Signed)
Cancer of the Uterus The uterus is part of a woman's reproductive system. It is the hollow, pear-shaped organ where a baby grows. The uterus is in the pelvis between the bladder and the rectum. The narrow, lower portion of the uterus is the cervix. The fallopian tubes extend from either side of the top of the uterus to the ovaries. The wall of the uterus has two layers of tissue. The inner layer, or lining, is the endometrium. The outer layer is muscle tissue called the myometrium. In women of childbearing age, the lining of the uterus grows and thickens each month to prepare for pregnancy. If a woman does not become pregnant, the thick, bloody lining flows out of the body through the vagina. This flow is called menstruation. TYPES OF UTERINE CANCER  The most common type of cancer of the uterus begins in the lining (endometrium). It is called endometrial cancer, uterine cancer, or cancer of the uterus. It is seen in 2% to 3% of women.   A different type of cancer, uterine sarcoma, develops in the muscle (myometrium). Cancer that begins in the cervix is also a different type of cancer.   Rarely, a noncancerous fibroid tumor of the uterus develops into a sarcoma.  CAUSES  No one knows the exact causes of uterine cancer. But it is clear that this disease is not contagious. No one can "catch" cancer from another person. Women who get this disease are more likely than other women to have certain risk factors. A risk factor is something that increases a person's chance of developing the disease.  Most women who have known risk factors do not get uterine cancer. On the other hand, many who do get this disease have none of these factors. Doctors can seldom explain why one woman gets uterine cancer and another does not.   Studies have found the following risk factors:   Age. Cancer of the uterus occurs mostly in women over age 50.   Endometrial hyperplasia (enlarged endometrium). The risk of uterine cancer  is higher if a woman has endometrial hyperplasia.   Hormone replacement therapy (HRT). HRT is used to control the symptoms of menopause, to prevent osteoporosis (thinning of the bones), and to reduce the risk of heart disease or stroke. Women who still have their uterus, and use estrogen without progesterone, have an increased risk of uterine cancer. Long-term use and large doses of estrogen seem to increase this risk. Women who use a combination of estrogen and progesterone have a lower risk of uterine cancer than women who use estrogen alone. The progesterone protects the uterus from developing cancer.   Obesity and related conditions. The body stores and releases some of its estrogen in fatty tissue. That is why obese women are more likely than thin women to have higher levels of estrogen in their bodies. High levels of estrogen may be the reason that obese women have an increased risk of developing uterine cancer. The risk of this disease is also higher in women with diabetes or high blood pressure. These conditions occur in many obese women.   Tamoxifen. Women taking the drug tamoxifen to prevent or treat breast cancer have an increased risk of uterine cancer. This risk appears to be related to the estrogen-like effect of this drug on the uterus.   Race. White women are more likely than African-American women to get uterine cancer.   Colorectal cancer. Women who have had an inherited form of colorectal cancer have a higher risk of   developing uterine cancer than other women.   Infertility.   Beginning menstrual periods before age 12.   Having menstrual periods after age 52.   History of cancer of the ovary or intestine.   Family history of uterine cancer.   Having diabetes, high blood pressure, thyroid or gallbladder disease.   Long-term use of high does of birth control pills. Birth control pills today are low in hormone doses.   Radiation to the abdomen or pelvis.   Smoking.    SYMPTOMS  Uterine cancer usually occurs after menopause. But it may also occur around the time that menopause begins. Abnormal vaginal bleeding is the most common symptom of uterine cancer. Bleeding may start as a watery, blood-streaked flow that gradually contains more blood. Women should not assume that abnormal vaginal bleeding is part of menopause. A woman should see her caregiver if she has any of the following symptoms:  Unusual vaginal bleeding or discharge.   Difficult or painful urination.   Pain during intercourse.   Pain in the pelvic area.   Increased girth (growth) of the stomach.   Any vaginal bleeding after menopause.   Unexplained weight loss.  These symptoms can be caused by cancer or other less serious conditions. Most often they are not cancer. But a thorough evaluation is needed to be certain. DIAGNOSIS  If a woman has symptoms that suggest uterine cancer, her caregiver may check her general health and may order blood and urine tests. The caregiver also may perform one or more of these exams or tests.  Blood and urine tests and chest x-rays. The woman also may have:   Other X-rays.   CT scans.   Ultrasound test.   Magnetic resonance imaging (MRI).   Sigmoidoscopy.   Colonoscopy.   Pelvic exam. A woman will have a pelvic exam to check the vagina, uterus, bladder, and rectum. The caregiver feels these organs for any lumps or changes in their shape or size. To see the upper part of the vagina and the cervix, the caregiver inserts an instrument called a speculum into the vagina.   Pap test. The caregiver collects cells from the cervix and upper vagina. A medical laboratory checks for abnormal cells. The Pap test is better for detecting cancer of the cervix. But cells from inside the uterus usually do not show up on a Pap test. It is not a reliable test for uterine cancer.   Transvaginal ultrasound. The medical caregiver inserts an instrument into the vagina.  The instrument aims high-frequency sound waves at the uterus. The pattern of the echoes they produce creates a picture. If the endometrium looks too thick, the caregiver can do a biopsy.   Biopsy. The medical caregiver removes a sample of tissue from the uterine lining. This usually can be done in the caregiver's office.   Dilatation and Curettage (D&C). In some cases, a woman may need to have a D&C. D&C is usually done as same-day surgery with anesthesia in a hospital. A pathologist examines the tissue (lining of the uterus) to check for cancer cells and other conditions.  STAGING   If uterine cancer is diagnosed, the caregiver needs to know the stage, or extent, of the disease to plan the best treatment. Staging is a careful attempt to find out whether the cancer has spread, and if so, to what parts of the body.   When uterine cancer spreads (metastasizes) outside the uterus, cancer cells are often found in nearby lymph nodes, nerves, or blood   vessels. If the cancer has reached the lymph nodes, cancer cells may have spread to other lymph nodes and other organs of the body.   Staging is done at the time of surgery. In most cases, the most reliable way to stage this disease is to remove the uterus, cervix, tubes, ovaries, and lymph nodes. A pathologist uses a microscope to examine the uterus and other tissues removed by the surgeon, to determine the extent of the cancer in the pelvis.   If lymph nodes have cancer cells, other parts of the body are examined, to see if it has spread to other organs.  MAIN FEATURES OF EACH STAGE OF THE DISEASE: Stage I. The cancer is only in the body of the uterus. It is not in the cervix. Stage II. The cancer has spread from the body of the uterus to the cervix. Stage III. The cancer has spread outside the uterus, but not outside the pelvis (and not to the bladder or rectum). Lymph nodes in the pelvis may contain cancer cells. Stage IV. The cancer has spread into the  bladder or rectum. It may have spread beyond the pelvis to other body parts. TREATMENT  Women with uterine cancer have many treatment options. Most women with uterine cancer are treated with surgery. Some have radiation or chemotherapy. A smaller number of women may be treated with hormonal therapy. Some patients receive a combination of therapies. You may want to consult with another cancer doctor for a second opinion. The caregiver (usually a cancer doctor) is the best person to describe your treatment choices and to discuss the expected results of treatment. SURGERY  Most women with uterine cancer have surgery to remove the uterus, cervix, tubes, and ovaries (total hysterectomy). This is usually done through an incision in the abdomen.   The doctor may also remove the lymph nodes near the tumor, to see if they contain cancer. If cancer cells have reached the lymph nodes, it may mean that the disease has spread to other parts of the body. If cancer cells have not spread beyond the endometrium, the woman may not need to have any other treatment. The length of the hospital stay may vary from several days to a week.  RADIATION THERAPY  In radiation therapy, high-energy rays are used to kill cancer cells. Like surgery, radiation therapy is a local therapy. It affects cancer cells only in the treated area.   Some women with Stage I, II, or III uterine cancer need both radiation therapy and surgery. They may have radiation before surgery to shrink the tumor, or after surgery to destroy any cancer cells that remain in the area. The doctor may suggest radiation treatments for the small number of women who cannot have surgery.   Doctors use two types of radiation therapy to treat uterine cancer:   External radiation. In external radiation therapy, a large machine outside the body is used to aim radiation at the tumor area. The woman usually does not stay overnight (outpatient) at the hospital or clinic, and  receives external radiation 5 days a week for several weeks. This schedule helps protect healthy cells and tissue by spreading out the total dose of radiation. No radioactive materials are put into the body for external radiation therapy.   Internal radiation. In internal radiation therapy, tiny tubes containing a radioactive substance are inserted through the vagina and cervix, into the uterus, and left in place for a few days. The woman stays in the hospital during this   treatment. To protect others from radiation exposure, the patient may not be able to have visitors or may have visitors only for a short period of time while the implant is in place. Once the implant is removed, the woman has no radioactivity in her body.   Some patients need both external and internal radiation therapies.  CHEMOTHERAPY Chemotherapy is not usually used for endometrial cancer of the uterus. However, with sarcoma of the uterus or of the fibroid, it may be used in combination with surgery. Chemotherapy may also be used with recurring sarcoma, and in patients who cannot have surgery. HORMONE THERAPY Hormonal therapy involves substances that prevent cancer cells from multiplying or growing by attaching to hormone receptors. This causes changes in cancer cells. Before therapy begins, the caregiver may request a hormone receptor test. This special lab test of uterine tissue helps the caregiver learn if estrogen and progesterone receptors are present. If the tissue has receptors, the woman is more likely to respond to hormonal therapy.  Hormonal therapy is called a systemic therapy, because it can affect cancer cells throughout the body. Usually, hormonal therapy is a type of progesterone, taken as a pill or injection.   The doctor may use hormonal therapy for women with uterine cancer who are unable to have surgery or radiation therapy. Also, the doctor may give hormonal therapy to women with uterine cancer that has spread to  the lungs or other distant sites. It is also given to women with uterine cancer that has come back.   Hormonal therapy can cause a number of side effects. Women taking progesterone may retain fluid, have an increased appetite, and gain weight. Women who are still menstruating may have changes in their periods.   Hormone therapy can be used in combination with surgery or radiation.  HOME CARE INSTRUCTIONS   Maintain a normal weight with a healthy balanced diet and exercise.   If you have diabetes, high blood pressure, thyroid or gallbladder disease, keep them in control with your caregiver's treatment and recommendations.   Do not smoke.   Do not take estrogen without taking progesterone with it, for menopausal symptoms.   Join a support group or get counseling, if you would like help dealing with your cancer.   If you are on hormone replacement therapy, see your caregiver as recommended, and be informed about the side effects of HRT.   Women with known risk factors should ask their caregiver what symptoms to look for and how often they should have an examination.   Keep your follow-up appointments and take your medicines as advised.   Write your questions down, and take them with you to your caregiver's appointments.   You may want another person to be with you for your appointments, so you do not miss any instructions.  SEEK MEDICAL CARE IF:   You have any abnormal vaginal bleeding.   You are having menstrual periods at the age of 52 or older.   You have bleeding after sexual intercourse.   You are taking tomoxifen and develop vaginal bleeding.   Your stomach is growing, and you are not pregnant.   You have pain with sexual intercourse.   You have stomach or pelvis pain.   You have weight loss for no known reason.   You have pain or difficulty with urination.  [facility] B[facility]TS  Cancer Information Service (CIS) provides accurate, up-to-date information  on cancer to patients and their families, health professionals, and the general public:    Phone: 1-800-4-CANCER (1-800-422-6237).   Internet: http://www.cancer.gov  NCI's website contains complete information about cancer causes and prevention, screening and diagnosis, treatment and survivorship, clinical trials, statistics, funding, training, and employment opportunities, and the Institute and its programs. CLINICAL TRIALS A woman who is interested in being part of a clinical trial should talk with her caregiver. NCI's website (http://www.cancer.gov) provides general information about clinical trials. It also offers detailed information about specific ongoing studies of uterine cancer by linking to PDQ, a cancer information database developed by the NCI. The Cancer Information Service at 1-800-4-CANCER can answer questions about cancer and provide information from the PDQ database. Document Released: 07/20/2005 Document Revised: 07/09/2011 Document Reviewed: 05/23/2009 ExitCare Patient Information 2012 [employer]. 

## 2012-05-17 ENCOUNTER — Ambulatory Visit: Payer: Medicare Other | Attending: Gynecologic Oncology | Admitting: Gynecologic Oncology

## 2012-05-17 ENCOUNTER — Encounter: Payer: Self-pay | Admitting: Gynecologic Oncology

## 2012-05-17 VITALS — BP 130/80 | HR 64 | Temp 98.3°F | Resp 16 | Ht 64.61 in | Wt 188.9 lb

## 2012-05-17 DIAGNOSIS — C549 Malignant neoplasm of corpus uteri, unspecified: Secondary | ICD-10-CM | POA: Insufficient documentation

## 2012-05-17 DIAGNOSIS — Z801 Family history of malignant neoplasm of trachea, bronchus and lung: Secondary | ICD-10-CM | POA: Insufficient documentation

## 2012-05-17 DIAGNOSIS — Z79899 Other long term (current) drug therapy: Secondary | ICD-10-CM | POA: Insufficient documentation

## 2012-05-17 DIAGNOSIS — I10 Essential (primary) hypertension: Secondary | ICD-10-CM | POA: Insufficient documentation

## 2012-05-17 DIAGNOSIS — Z90710 Acquired absence of both cervix and uterus: Secondary | ICD-10-CM | POA: Insufficient documentation

## 2012-05-17 DIAGNOSIS — K219 Gastro-esophageal reflux disease without esophagitis: Secondary | ICD-10-CM | POA: Insufficient documentation

## 2012-05-17 DIAGNOSIS — C541 Malignant neoplasm of endometrium: Secondary | ICD-10-CM

## 2012-05-17 DIAGNOSIS — Z9079 Acquired absence of other genital organ(s): Secondary | ICD-10-CM | POA: Insufficient documentation

## 2012-05-17 NOTE — Patient Instructions (Signed)
Please follow up with Dr. Erin Fulling in March 2014 and Dr. Nelly Rout at [facility] in one year.  Please report any signs and symptoms of recurrence.  Thank you for coming to see [place] today.  We appreciate your confidence in choosing [facility] for your medical care.  If you have any questions about your visit today, please call our office and we will get back to you as soon as possible.  Dr. Laurette Schimke and Warner Mccreedy, NP Gynecologic Oncology

## 2012-05-17 NOTE — Progress Notes (Signed)
Follow Up Note: Gyn-Onc  [name] 66 y.o. female  CC:  Chief Complaint  Patient presents with  . Endometrial cancer    follow up   HPI:  [name] is a 66 year old, who presented to Dr. Gaynell Face with complaints of vaginal bleeding and an D and C on February 18, 2011.  Pathology noted the presence of a grade 1 endometrioid adenocarcinoma. On March 31, 2011, she underwent a robotic-assisted laparoscopic hysterectomy, bilateral salpingo-oophorectomy, and left pelvic lymph node dissection. Final pathology was notable for a grade 1 lesion. There was 2 mm of invasion into the myometrium where it was 13 mm in thickness. There was no cervical stromal involvement or lymphovascular space invasion.   Interval History:  [name] is here for follow up.  After her last visit in October 2012, she reports developing vaginal spotting in November 2012, which she went to her primary care physician for.  Stating that her primary care physician did not perform an exam and informed her to follow up with Dr. Gaynell Face in March of 2013.  She reports that at her visit with Dr. Gaynell Face in March 2013, granulation tissue was identified in the vagina and silver nitrate was used.  Denies vaginal bleeding since that time.  Stating that a pap smear was obtained in March 2013.  Reporting that she is unable to see Dr. Gaynell Face for future visits because his office does not take [employer].  Recently saw Dr. Willodean Rosenthal in September 2013 for GYN follow up.  Reporting improvement in vaginal dryness.  Review of Systems  Constitutional:  Feels well.  Cardiovascular: No chest pain, palpitations or edema.  Respiratory: No shortness of breath, wheezing or cough.  Gastrointestinal:  No change in bowel movements, nausea, vomiting, diarrhea, or constipation.  No bright red bleeding per rectum. Genitourinary: No pelvic pain, pelvic pressure, or changes in urinary function.  No hematuria, dysuria, or incontinence.  No  vaginal bleeding or discharge. Musculoskeletal:  No muscle weakness, change in gait, or joint pain.  Psychiatric:  No depression, anxiety, or insomnia. Hematologic/Lymphatic: No easy bruising or bleeding  Current Meds:  Outpatient Encounter Prescriptions as of 05/17/2012  Medication Sig Dispense Refill  . Fish Oil OIL Take 1 tablet by mouth daily. [employer] chewables       . hydrochlorothiazide (MICROZIDE) 12.5 MG capsule Take 1 capsule (12.5 mg total) by mouth daily.  90 capsule  3  . ibuprofen (ADVIL,MOTRIN) 200 MG tablet Take 200 mg by mouth 2 (two) times daily. As needed for pain       . Multiple Vitamins-Minerals (CENTRUM SILVER ULTRA WOMENS PO) Take 1 tablet by mouth every morning.        . Polyethylene Glycol 3350 (MIRALAX PO) Take 15 mLs by mouth daily. With 6-8 oz glass of water       Allergy:  Allergies  Allergen Reactions  . Codeine Rash    REACTION: RASH   Social Hx:   History   Social History  . Marital Status: Married    Spouse Name: N/A    Number of Children: N/A  . Years of Education: N/A   Occupational History  . Not on file.   Social History Main Topics  . Smoking status: Never Smoker   . Smokeless tobacco: Never Used  . Alcohol Use: 1.2 oz/week    2 Cans of beer per week  . Drug Use: No  . Sexually Active: Yes    Birth Control/ Protection: Post-menopausal  Other Topics Concern  . Not on file   Social History Narrative   [place]; remarried [id number] son [id number]: [employer].   Past Surgical Hx:  Past Surgical History  Procedure Date  . Colonoscopy 2007  . Bone spur removed from left foot 2006  . Hysteroscopy w/d&c 02/18/2011    Procedure: DILATATION AND CURETTAGE (D&C) /HYSTEROSCOPY;  Surgeon: Kathreen Cosier, MD;  Location: [facility];  Service: Gynecology;  Laterality: N/A;  . Robotic assisted lap vaginal hysterectomy 03/31/11    hyst with SBO due to endometrial cancer   Past  Medical Hx:  Past Medical History  Diagnosis Date  . Hypertension   . GERD (gastroesophageal reflux disease)     On no meds  . Post-menopausal bleeding 02/18/2011   Family Hx:  Family History  Problem Relation Age of Onset  . Hyperlipidemia Mother   . Hypertension Mother   . Cancer Father 47    lung cancer  . Hypertension Brother    Vitals:  Blood pressure 130/80, pulse 64, temperature 98.3 F (36.8 C), temperature source Oral, resp. rate 16, height 5' 4.61" (1.641 m), weight 188 lb 14.4 oz (85.684 kg).  Physical Exam: General: Well developed, well nourished in no acute distress.  Alert and oriented x3.  Lungs: Clear to auscultation bilaterally. No wheezes, crackles, or rhonchi.  Cardiovascular: Regular rate and rhythm. No murmurs, clicks, or rubs.  Abdomen: Soft, nontender, nondistended. No palpable masses.  Normal bowel sounds. No hernias. Incisions are intact and well-healed. Genitourinary: Normal EGBUS. Vaginal cuff healed. No bleeding or discharge. No cul de sac fullness. Rectal: Good tone, no masses no cul de sac nodularity.  Extremities: No cyanosis, clubbing or edema.  Back:  No CVAT LN.  No cervical supra clavicular or inguinal adenopathy  Assessment/Plan:  [name] is a 66 year old female with Stage IA, Grade 1 endometrioid endometrial adenocarcinoma.  On March 31, 2011, she underwent a robotic-assisted laparoscopic hysterectomy, bilateral salpingo-oophorectomy, and left pelvic lymph node dissection.  She is advised to follow up with Dr. Erin Fulling in March 2014 and Dr. Nelly Rout at Gynecologic Oncology in one year.  Reportable signs and symptoms of recurrence reviewed.  Samples of astroglide provided.  Dr. Laurette Schimke present during the examination and concurs with the above.  Aurianna Earlywine DEAL, NP 05/17/2012, 3:53 PM

## 2012-07-08 ENCOUNTER — Other Ambulatory Visit: Payer: Self-pay | Admitting: Internal Medicine

## 2012-07-08 DIAGNOSIS — Z1231 Encounter for screening mammogram for malignant neoplasm of breast: Secondary | ICD-10-CM

## 2012-07-13 ENCOUNTER — Encounter: Payer: BC Managed Care – PPO | Admitting: Internal Medicine

## 2012-07-14 ENCOUNTER — Encounter: Payer: Self-pay | Admitting: Internal Medicine

## 2012-07-14 ENCOUNTER — Ambulatory Visit (INDEPENDENT_AMBULATORY_CARE_PROVIDER_SITE_OTHER): Payer: Medicare Other | Admitting: Internal Medicine

## 2012-07-14 ENCOUNTER — Other Ambulatory Visit (INDEPENDENT_AMBULATORY_CARE_PROVIDER_SITE_OTHER): Payer: Medicare Other

## 2012-07-14 VITALS — BP 142/90 | HR 85 | Temp 98.4°F | Resp 10 | Ht 64.75 in | Wt 188.0 lb

## 2012-07-14 DIAGNOSIS — Z0000 Encounter for general adult medical examination without abnormal findings: Secondary | ICD-10-CM

## 2012-07-14 DIAGNOSIS — I10 Essential (primary) hypertension: Secondary | ICD-10-CM

## 2012-07-14 DIAGNOSIS — Z23 Encounter for immunization: Secondary | ICD-10-CM

## 2012-07-14 LAB — COMPREHENSIVE METABOLIC PANEL
ALT: 14 U/L (ref 0–35)
Albumin: 4.1 g/dL (ref 3.5–5.2)
Alkaline Phosphatase: 50 U/L (ref 39–117)
CO2: 27 mEq/L (ref 19–32)
GFR: 91.09 mL/min (ref >60.00)
Potassium: 4.2 mEq/L (ref 3.5–5.1)
Sodium: 135 mEq/L (ref 135–145)
Total Bilirubin: 0.6 mg/dL (ref 0.3–1.2)
Total Protein: 7.7 g/dL (ref 6.0–8.3)

## 2012-07-14 NOTE — Assessment & Plan Note (Signed)
Interval history - no major illness, surgery or injury. Physical exam, sans breast and pelvic, is normal. She is current with colorectal and breast cancer screening. Immunizations are brought up to date and she will check on coverage for shingles vaccine.  In summary - a very nice woman who is medically stable. She is encouraged to see her dentist and eye doctor. She is encouraged to develop a regular exercise program, i.e. Walking 30 min 3 times a week. She will return in 1 year or as needed.

## 2012-07-14 NOTE — Progress Notes (Signed)
Subjective:    Patient ID: [name], female    DOB: 12/30/1945, 66 y.o.   MRN: [id number]  HPI The patient is here for annual Medicare wellness examination and management of other chronic and acute problems.   The risk factors are reflected in the social history.  The roster of all physicians providing medical care to patient - is listed in the Snapshot section of the chart.  Activities of daily living:  The patient is 100% inedpendent in all ADLs: dressing, toileting, feeding as well as independent mobility  Home safety : The patient has smoke detectors in the home. Falls - no falls, home is fall safe. They wear seatbelts. No firearms at home  There is no risks for hepatitis, STDs or HIV. There is no history of blood transfusion. They have no travel history to infectious disease endemic areas of the world.  The patient has not seen their dentist in the last six month. They have not seen their eye doctor in the last year. They deny any hearing difficulty and have not had audiologic testing in the last year.    They do not  have excessive sun exposure. Discussed the need for sun protection: hats, long sleeves and use of sunscreen if there is significant sun exposure.   Diet: the importance of a healthy diet is discussed. They do have a healthy diet.  The patient has no regular exercise program.  The benefits of regular aerobic exercise were discussed.  Depression screen: there are no signs or vegative symptoms of depression- irritability, change in appetite, anhedonia, sadness/tearfullness.  Cognitive assessment: the patient manages all their financial and personal affairs and is actively engaged.   Past Medical History  Diagnosis Date  . Hypertension   . GERD (gastroesophageal reflux disease)     On no meds  . Post-menopausal bleeding 02/18/2011   Past Surgical History  Procedure Date  . Colonoscopy 2007  . Bone spur removed from left foot 2006  . Hysteroscopy w/d&c  02/18/2011    Procedure: DILATATION AND CURETTAGE (D&C) /HYSTEROSCOPY;  Surgeon: Kathreen Cosier, MD;  Location: [facility];  Service: Gynecology;  Laterality: N/A;  . Robotic assisted lap vaginal hysterectomy 03/31/11    hyst with SBO due to endometrial cancer   Family History  Problem Relation Age of Onset  . Hyperlipidemia Mother   . Hypertension Mother   . Cancer Father 45    lung cancer  . Hypertension Brother    History   Social History  . Marital Status: Married    Spouse Name: N/A    Number of Children: N/A  . Years of Education: N/A   Occupational History  . Not on file.   Social History Main Topics  . Smoking status: Never Smoker   . Smokeless tobacco: Never Used  . Alcohol Use: 1.2 oz/week    2 Cans of beer per week  . Drug Use: No  . Sexually Active: Yes    Birth Control/ Protection: Post-menopausal   Other Topics Concern  . Not on file   Social History Narrative   [place]; remarried [id number] son 1967Work: [employer].    Current Outpatient Prescriptions on File Prior to Visit  Medication Sig Dispense Refill  . Fish Oil OIL Take 1 tablet by mouth daily. [employer] chewables       . hydrochlorothiazide (MICROZIDE) 12.5 MG capsule Take 1 capsule (12.5 mg total) by mouth daily.  90 capsule  3  . ibuprofen (ADVIL,MOTRIN) 200 MG tablet Take 200 mg by mouth 2 (two) times daily. As needed for pain       . Multiple Vitamins-Minerals (CENTRUM SILVER ULTRA WOMENS PO) Take 1 tablet by mouth every morning.           Vision, hearing, body mass index were assessed and reviewed.   During the course of the visit the patient was educated and counseled about appropriate screening and preventive services including : fall prevention , diabetes screening, nutrition counseling, colorectal cancer screening, and recommended immunizations.    Review of Systems Constitutional:  Negative for fever, chills, activity  change and unexpected weight change.  HEENT:  Negative for hearing loss, ear pain, congestion, neck stiffness and postnasal drip. Negative for sore throat or swallowing problems. Negative for dental complaints.   Eyes: Negative for vision loss or change in visual acuity.  Respiratory: Negative for chest tightness and wheezing. Negative for DOE.   Cardiovascular: Negative for chest pain or palpitations. No decreased exercise tolerance Gastrointestinal: No change in bowel habit. No bloating or gas. No reflux or indigestion Genitourinary: Negative for urgency, frequency, flank pain and difficulty urinating.  Musculoskeletal: Negative for myalgias, back pain, arthralgias and gait problem.  Neurological: Negative for dizziness, tremors, weakness and headaches.  Hematological: Negative for adenopathy.  Psychiatric/Behavioral: Negative for behavioral problems and dysphoric mood.       Objective:   Physical Exam Filed Vitals:   07/14/12 0903  BP: 142/90  Pulse: 85  Temp: 98.4 F (36.9 C)  Resp: 10   Wt Readings from Last 3 Encounters:  07/14/12 188 lb (85.276 kg)  05/17/12 188 lb 14.4 oz (85.684 kg)  04/08/12 188 lb 3.2 oz (85.367 kg)   Gen'l: well nourished, well developed AA Woman in no distress HEENT - [place]/AT, EACs/TMs normal, oropharynx with partial denture and remaining native dentition is in good condition, no buccal or palatal lesions, posterior pharynx clear, mucous membranes moist. C&S clear, PERRLA, fundi - normal Neck - supple, no thyromegaly Nodes- negative submental, cervical, supraclavicular regions Chest - no deformity, no CVAT Lungs - cleat without rales, wheezes. No increased work of breathing Breast - deferred to gyn Cardiovascular - regular rate and rhythm, quiet precordium, no murmurs, rubs or gallops, 2+ radial, DP and PT pulses Abdomen - BS+ x 4, no HSM, no guarding or rebound or tenderness Pelvic - deferred to gyn Rectal - deferred  Extremities - no clubbing,  cyanosis, edema or deformity.  Neuro - A&O x 3, CN II-XII normal, motor strength normal and equal, DTRs 2+ and symmetrical biceps, radial, and patellar tendons. Cerebellar - no tremor, no rigidity, fluid movement and normal gait. Derm - Head, neck, back, abdomen and extremities without suspicious lesions  Lab Results  Component Value Date   WBC 6.1 04/01/2011   HGB 11.9* 04/01/2011   HCT 36.0 04/01/2011   PLT 167 04/01/2011   GLUCOSE 107* 07/14/2012   CHOL 197 04/29/2009   TRIG 64.0 04/29/2009   HDL 74.50 04/29/2009   LDLCALC 110* 04/29/2009   ALT 14 07/14/2012   AST 21 07/14/2012   NA 135 07/14/2012   K 4.2 07/14/2012   CL 102 07/14/2012   CREATININE 0.8 07/14/2012   BUN 12 07/14/2012   CO2 27 07/14/2012   TSH 1.46 04/29/2009         Assessment & Plan:

## 2012-07-14 NOTE — Patient Instructions (Addendum)
Please check with [employer] for coverage for shingles vaccine.  You are doing good. Watch the weight: smart food choices, PORTION SIZE control, exercise.   Exam is good. Full report to follow including lab work.

## 2012-07-14 NOTE — Assessment & Plan Note (Signed)
BP Readings from Last 3 Encounters:  07/14/12 142/90  05/17/12 130/80  04/08/12 151/79   Adequate control on present medication.  Plan Monitor BP at home or drugstore - report back if systolic (top) reading is running greater than 140.   Routine lab today.

## 2012-08-10 ENCOUNTER — Ambulatory Visit (HOSPITAL_COMMUNITY)
Admission: RE | Admit: 2012-08-10 | Discharge: 2012-08-10 | Disposition: A | Discharge status: Home / Self Care | Payer: Medicare Other | Source: Ambulatory Visit | Attending: Internal Medicine | Admitting: Internal Medicine

## 2012-08-10 DIAGNOSIS — Z1231 Encounter for screening mammogram for malignant neoplasm of breast: Secondary | ICD-10-CM

## 2012-09-21 ENCOUNTER — Ambulatory Visit: Payer: Medicare Other | Admitting: Obstetrics & Gynecology

## 2012-10-10 ENCOUNTER — Encounter: Payer: Self-pay | Admitting: Obstetrics & Gynecology

## 2012-10-10 ENCOUNTER — Ambulatory Visit (INDEPENDENT_AMBULATORY_CARE_PROVIDER_SITE_OTHER): Payer: Medicare Other | Admitting: Obstetrics & Gynecology

## 2012-10-10 VITALS — BP 134/85 | HR 72 | Temp 98.7°F | Ht 64.5 in | Wt 182.4 lb

## 2012-10-10 DIAGNOSIS — Z01419 Encounter for gynecological examination (general) (routine) without abnormal findings: Secondary | ICD-10-CM

## 2012-10-10 DIAGNOSIS — Z8542 Personal history of malignant neoplasm of other parts of uterus: Secondary | ICD-10-CM

## 2012-10-10 NOTE — Patient Instructions (Signed)
Breast Self-Awareness  Practicing breast self-awareness may pick up problems early, prevent significant medical complications, and possibly save your life. By practicing breast self-awareness, you can become familiar with how your breasts look and feel and if your breasts are changing. This allows you to notice changes early. It can also offer you some reassurance that your breast health is good. One way to learn what is normal for your breasts and whether your breasts are changing is to do a breast self-exam.  If you find a lump or something that was not present in the past, it is best to contact your caregiver right away. Other findings that should be evaluated by your caregiver include nipple discharge, especially if it is bloody; skin changes or reddening; areas where the skin seems to be pulled in (retracted); or new lumps and bumps. Breast pain is seldom associated with cancer (malignancy), but should also be evaluated by a caregiver.  BREAST SELF-EXAM  The best time to examine your breasts is 5 7 days after your menstrual period is over. During menstruation, the breasts are lumpier, and it may be more difficult to pick up changes. If you do not menstruate, have reached menopause, or had your uterus removed (hysterectomy), you should examine your breasts at regular intervals, such as monthly. If you are breastfeeding, examine your breasts after a feeding or after using a breast pump. Breast implants do not decrease the risk for lumps or tumors, so continue to perform breast self-exams as recommended. Talk to your caregiver about how to determine the difference between the implant and breast tissue. Also, talk about the amount of pressure you should use during the exam. Over time, you will become more familiar with the variations of your breasts and more comfortable with the exam. A breast self-exam requires you to remove all your clothes above the waist.    Look at your breasts and nipples. Stand in front of  a mirror in a room with good lighting. With your hands on your hips, push your hands firmly downward. Look for a difference in shape, contour, and size from one breast to the other (asymmetry). Asymmetry includes puckers, dips, or bumps. Also, look for skin changes, such as reddened or scaly areas on the breasts. Look for nipple changes, such as discharge, dimpling, repositioning, or redness.   Carefully feel your breasts. This is best done either in the shower or tub while using soapy water or when flat on your back. Place the arm (on the side of the breast you are examining) above your head. Use the pads (not the fingertips) of your three middle fingers on your opposite hand to feel your breasts. Start in the underarm area and use  inch (2 cm) overlapping circles to feel your breast. Use 3 different levels of pressure (light, medium, and firm pressure) at each circle before moving to the next circle. The light pressure is needed to feel the tissue closest to the skin. The medium pressure will help to feel breast tissue a little deeper, while the firm pressure is needed to feel the tissue close to the ribs. Continue the overlapping circles, moving downward over the breast until you feel your ribs below your breast. Then, move one finger-width towards the center of the body. Continue to use the  inch (2 cm) overlapping circles to feel your breast as you move slowly up toward the collar bone (clavicle) near the base of the neck. Continue the up and down exam using all 3 pressures   until you reach the middle of the chest. Do this with each breast, carefully feeling for lumps or changes.   Keep a written record with breast changes or normal findings for each breast. By writing this information down, you do not need to depend only on memory for size, tenderness, or location. Write down where you are in your menstrual cycle, if you are still menstruating.   Breast tissue can have some lumps or thick tissue. However,  see your caregiver if you find anything that concerns you.   SEEK MEDICAL CARE IF:   You see a change in shape, contour, or size of your breasts or nipples.    You see skin changes, such as reddened or scaly areas on the breasts or nipples.    You have an unusual discharge from your nipples.    You feel a new lump or unusually thick areas.   Document Released: 07/20/2005 Document Revised: 01/19/2012 Document Reviewed: 11/04/2011  [employer] Patient Information 2013 [employer].

## 2012-10-10 NOTE — Progress Notes (Signed)
Patient ID: [name], female   DOB: 20-Jul-1946, 67 y.o.   MRN: [id number] Subjective:     [name] is a 67 y.o. female here for a routine exam. Pt has a h/o uterine cancer.  She will alternate between GYN ONC and GYN for her 6 months checks.  She denies GYN problems currently.  She has occ hot flashes which she 'handles' without difficulty.   Gynecologic History No LMP recorded. Patient has had a hysterectomy. Contraception: none Last mammogram: 2014. Results were: normal  Obstetric History OB History   Grav Para Term Preterm Abortions TAB SAB Ect Mult Living   1 1 1   0  0   1     # Outc Date GA Lbr Len/2nd Wgt Sex Del Anes PTL Lv   1 TRM 1/67    M SVD   Yes       The following portions of the patient's history were reviewed and updated as appropriate: allergies, current medications, past family history, past medical history, past social history, past surgical history and problem list.  Review of Systems A comprehensive review of systems was negative.    Objective:    BP 134/85  Pulse 72  Temp(Src) 98.7 F (37.1 C) (Oral)  Ht 5' 4.5" (1.638 m)  Wt 182 lb 6.4 oz (82.736 kg)  BMI 30.84 kg/m2  General Appearance:    Alert, cooperative, no distress, appears stated age  Head:    Normocephalic, without obvious abnormality, atraumatic              Neck:   Supple, symmetrical, trachea midline, no adenopathy;    thyroid:  no enlargement/tenderness/nodules; no carotid   bruit or JVD  Back:     Symmetric, no curvature, ROM normal, no CVA tenderness  Lungs:     Clear to auscultation bilaterally, respirations unlabored  Chest Wall:    No tenderness or deformity   Heart:    Regular rate and rhythm, S1 and S2 normal, no murmur, rub   or gallop  Breast Exam:    No tenderness, masses, or nipple abnormality  Abdomen:     Soft, non-tender, bowel sounds active all four quadrants,    no masses, no organomegaly  Genitalia:    Normal female without lesion, discharge or tenderness   GU: EGBUS: no lesions Vagina: no blood in vault Cervix and uterus surgically absent Adnexa: no masses; nontender       Extremities:   Extremities normal, atraumatic, no cyanosis or edema  Pulses:   2+ and symmetric all extremities  Skin:   Skin color, texture, turgor normal, no rashes or lesions  Lymph nodes:   Cervical, supraclavicular, and axillary nodes normal    Assessment:    Healthy female exam.  H/o uterine cancer- no evidence of recurrence   Plan:    Follow up in: 1 year.

## 2013-02-24 ENCOUNTER — Other Ambulatory Visit: Payer: Self-pay | Admitting: Internal Medicine

## 2013-04-20 ENCOUNTER — Ambulatory Visit: Payer: Medicare Other | Attending: Gynecologic Oncology | Admitting: Gynecologic Oncology

## 2013-04-20 ENCOUNTER — Encounter: Payer: Self-pay | Admitting: Gynecologic Oncology

## 2013-04-20 VITALS — BP 161/81 | HR 72 | Temp 98.1°F | Resp 18 | Ht 64.61 in | Wt 189.2 lb

## 2013-04-20 DIAGNOSIS — I10 Essential (primary) hypertension: Secondary | ICD-10-CM | POA: Insufficient documentation

## 2013-04-20 DIAGNOSIS — C549 Malignant neoplasm of corpus uteri, unspecified: Secondary | ICD-10-CM | POA: Insufficient documentation

## 2013-04-20 DIAGNOSIS — Z9079 Acquired absence of other genital organ(s): Secondary | ICD-10-CM | POA: Insufficient documentation

## 2013-04-20 DIAGNOSIS — C541 Malignant neoplasm of endometrium: Secondary | ICD-10-CM

## 2013-04-20 DIAGNOSIS — Z90710 Acquired absence of both cervix and uterus: Secondary | ICD-10-CM | POA: Insufficient documentation

## 2013-04-20 NOTE — Progress Notes (Signed)
Follow Up Note: Gyn-Onc  [name] 67 y.o. female  CC:  Chief Complaint  Patient presents with  . Endo ca    Follow up   HPI:  [name] is a 66 year old, who presented to Dr. Gaynell Face with complaints of vaginal bleeding and an D and C on February 18, 2011.  Pathology noted the presence of a grade 1 endometrioid adenocarcinoma. On March 31, 2011, she underwent a robotic-assisted laparoscopic hysterectomy, bilateral salpingo-oophorectomy, and left pelvic lymph node dissection. Final pathology was notable for a grade 1 lesion. There was 2 mm of invasion into the myometrium where it was 13 mm in thickness. There was no cervical stromal involvement or lymphovascular space invasion.   Interval History:  [name] is here for follow up.  She denies  vaginal bleeding.     Reporting improvement in vaginal dryness, no vaginal bleeding no SOB, abdominal pain.  Review of Systems  Constitutional:  Feels well.  Cardiovascular: No chest pain, palpitations or edema.  Respiratory: No shortness of breath, wheezing or cough.  Gastrointestinal:  No change in bowel movements, nausea, vomiting, diarrhea, or constipation.  No bright red bleeding per rectum. Genitourinary: No pelvic pain, pelvic pressure, or changes in urinary function.  No hematuria, dysuria, or incontinence.  No vaginal bleeding or discharge. Musculoskeletal:  No muscle weakness, change in gait, or joint pain.  Psychiatric:  No depression, anxiety, or insomnia. Hematologic/Lymphatic: No easy bruising or bleeding   Social Hx:   History   Social History  . Marital Status: Married    Spouse Name: N/A    Number of Children: N/A  . Years of Education: N/A   Occupational History  . Not on file.   Social History Main Topics  . Smoking status: Never Smoker   . Smokeless tobacco: Never Used  . Alcohol Use: 1.2 oz/week    2 Cans of beer per week  . Drug Use: No  . Sexual Activity: Yes    Birth Control/ Protection: Post-menopausal    Other Topics Concern  . Not on file   Social History Narrative   HSG- [place]   Married 1987, 1 year divorced; remarried 2003   1 son 33   Work: Geophysicist/field seismologist.        28 year old stepson is residing with her and there is much stress in the household.  Past Surgical Hx:  Past Surgical History  Procedure Laterality Date  . Colonoscopy  2007  . Bone spur removed from left foot  2006  . Hysteroscopy w/d&c  02/18/2011    Procedure: DILATATION AND CURETTAGE (D&C) /HYSTEROSCOPY;  Surgeon: Kathreen Cosier, MD;  Location: [facility];  Service: Gynecology;  Laterality: N/A;  . Robotic assisted lap vaginal hysterectomy  03/31/11    hyst with SBO due to endometrial cancer   Past Medical Hx:  Past Medical History  Diagnosis Date  . Hypertension   . GERD (gastroesophageal reflux disease)     On no meds  . Post-menopausal bleeding 02/18/2011   Family Hx:  Family History  Problem Relation Age of Onset  . Hyperlipidemia Mother   . Hypertension Mother   . Cancer Father 50    lung cancer  . Hypertension Brother    Vitals:  Blood pressure 161/81, pulse 72, temperature 98.1 F (36.7 C), resp. rate 18, height 5' 4.61" (1.641 m), weight 189 lb 3.2 oz (85.821 kg).  Physical Exam: General: Well developed, well nourished in no acute distress.  Alert  and oriented x3.  Lungs: Clear to auscultation bilaterally. No wheezes,   Cardiovascular: Regular rate and rhythm. No murmurs,    Abdomen: Soft, nontender, nondistended. No palpable masses.  Normal bowel sounds. No hernias. Incisions are intact and well-healed. Genitourinary: Normal EGBUS. Vaginal cuff healed. No bleeding or discharge. No cul de sac fullness. Rectal: Good tone, no masses no cul de sac nodularity.  Extremities: No cyanosis, clubbing or edema.  Back:  No CVAT LN.  No cervical supra clavicular or inguinal adenopathy  Assessment/Plan:  [name] is a 67 y.o. with Stage IA, Grade 1 endometrioid endometrial  adenocarcinoma.  On March 31, 2011, she underwent a robotic-assisted laparoscopic hysterectomy, bilateral salpingo-oophorectomy, and left pelvic lymph node dissection.  There is no evidence of disease.  Ms Iglesia was advised to follow up with Dr. Erin Fulling annually Counseled about the s/Sx of recurrence. F/U with Gyn Onc PRN   Laurette Schimke, NP 04/20/2013, 3:18 PM

## 2013-04-20 NOTE — Patient Instructions (Addendum)
Plan to follow up with Dr. Erin Fulling in one year or sooner if needed.  Cancer of the Uterus Stage IA No evidence of disease.  The uterus is part of a woman's reproductive system. It is the hollow, pear-shaped organ where a baby grows. The uterus is in the pelvis between the bladder and the rectum. The narrow, lower portion of the uterus is the cervix. The fallopian tubes extend from either side of the top of the uterus to the ovaries. The wall of the uterus has two layers of tissue. The inner layer, or lining, is the endometrium. The outer layer is muscle tissue called the myometrium. In women of childbearing age, the lining of the uterus grows and thickens each month to prepare for pregnancy. If a woman does not become pregnant, the thick, bloody lining flows out of the body through the vagina. This flow is called menstruation. TYPES OF UTERINE CANCER  The most common type of cancer of the uterus begins in the lining (endometrium). It is called endometrial cancer, uterine cancer, or cancer of the uterus. It is seen in 2% to 3% of women.  A different type of cancer, uterine sarcoma, develops in the muscle (myometrium). Cancer that begins in the cervix is also a different type of cancer.  Rarely, a noncancerous fibroid tumor of the uterus develops into a sarcoma. CAUSES  No one knows the exact causes of uterine cancer. But it is clear that this disease is not contagious. No one can "catch" cancer from another person. Women who get this disease are more likely than other women to have certain risk factors. A risk factor is something that increases a person's chance of developing the disease.  Most women who have known risk factors do not get uterine cancer. On the other hand, many who do get this disease have none of these factors. Doctors can seldom explain why one woman gets uterine cancer and another does not.  Studies have found the following risk factors:  Age. Cancer of the uterus  occurs mostly in women over age 29.  Endometrial hyperplasia (enlarged endometrium). The risk of uterine cancer is higher if a woman has endometrial hyperplasia.  Hormone replacement therapy (HRT). HRT is used to control the symptoms of menopause, to prevent osteoporosis (thinning of the bones), and to reduce the risk of heart disease or stroke. Women who still have their uterus, and use estrogen without progesterone, have an increased risk of uterine cancer. Long-term use and large doses of estrogen seem to increase this risk. Women who use a combination of estrogen and progesterone have a lower risk of uterine cancer than women who use estrogen alone. The progesterone protects the uterus from developing cancer.  Obesity and related conditions. The body stores and releases some of its estrogen in fatty tissue. That is why obese women are more likely than thin women to have higher levels of estrogen in their bodies. High levels of estrogen may be the reason that obese women have an increased risk of developing uterine cancer. The risk of this disease is also higher in women with diabetes or high blood pressure. These conditions occur in many obese women.  Tamoxifen. Women taking the drug tamoxifen to prevent or treat breast cancer have an increased risk of uterine cancer. This risk appears to be related to the estrogen-like effect of this drug on the uterus.  Race. White women are more likely than African-American women to get uterine cancer.  Colorectal cancer. Women who have had  an inherited form of colorectal cancer have a higher risk of developing uterine cancer than other women.  Infertility.  Beginning menstrual periods before age 76.  Having menstrual periods after age 4.  History of cancer of the ovary or intestine.  Family history of uterine cancer.  Having diabetes, high blood pressure, thyroid or gallbladder disease.  Long-term use of high does of birth control pills. Birth  control pills today are low in hormone doses.  Radiation to the abdomen or pelvis.  Smoking.   MAIN FEATURES OF EACH STAGE OF THE DISEASE: Stage I. The cancer is only in the body of the uterus. It is not in the cervix. Stage II. The cancer has spread from the body of the uterus to the cervix. Stage III. The cancer has spread outside the uterus, but not outside the pelvis (and not to the bladder or rectum). Lymph nodes in the pelvis may contain cancer cells. Stage IV. The cancer has spread into the bladder or rectum. It may have spread beyond the pelvis to other body parts. TREATMENT  Women with uterine cancer have many treatment options. Most women with uterine cancer are treated with surgery. Some have radiation or chemotherapy. A smaller number of women may be treated with hormonal therapy. Some patients receive a combination of therapies. You may want to consult with another cancer doctor for a second opinion. The caregiver (usually a cancer doctor) is the best person to describe your treatment choices and to discuss the expected results of treatment. SURGERY  Most women with uterine cancer have surgery to remove the uterus, cervix, tubes, and ovaries (total hysterectomy). This is usually done through an incision in the abdomen.  The doctor may also remove the lymph nodes near the tumor, to see if they contain cancer. If cancer cells have reached the lymph nodes, it may mean that the disease has spread to other parts of the body. If cancer cells have not spread beyond the endometrium, the woman may not need to have any other treatment. The length of the hospital stay may vary from several days to a week. HOME CARE INSTRUCTIONS   Maintain a normal weight with a healthy balanced diet and exercise.  If you have diabetes, high blood pressure, thyroid or gallbladder disease, keep them in control with your caregiver's treatment and recommendations.  Do not smoke.  Do not take estrogen without  taking progesterone with it, for menopausal symptoms.  Join a support group or get counseling, if you would like help dealing with your cancer.  If you are on hormone replacement therapy, see your caregiver as recommended, and be informed about the side effects of HRT.  Women with known risk factors should ask their caregiver what symptoms to look for and how often they should have an examination.  Keep your follow-up appointments and take your medicines as advised.  Write your questions down, and take them with you to your caregiver's appointments.  You may want another person to be with you for your appointments, so you do not miss any instructions. SEEK MEDICAL CARE IF:   You have any abnormal vaginal bleeding.    You have bleeding after sexual intercourse.    Your stomach is growing, and you are not pregnant.  You have pain with sexual intercourse.  You have stomach or pelvis pain.  You have weight loss for no known reason.  You have pain or difficulty with urination. [facility] BOOKLETS  Cancer Information Service (CIS) provides accurate,  up-to-date information on cancer to patients and their families, health professionals, and the general public:  Phone: 1-800-4-CANCER ((984)769-3038).  Internet: http://www.cancer.gov NCI's website contains complete information about cancer causes and prevention, screening and diagnosis, treatment and survivorship, clinical trials, statistics, funding, training, and employment opportunities, and [employer] and its programs. CLINICAL TRIALS A woman who is interested in being part of a clinical trial should talk with her caregiver. NCI's website (http://www.johnson-fowler.biz/) provides general information about clinical trials. It also offers detailed information about specific ongoing studies of uterine cancer by linking to PDQ, a cancer information database developed by the NCI. The Cancer Information Service at  1-800-4-CANCER can answer questions about cancer and provide information from the PDQ database. Document Released: 07/20/2005 Document Revised: 10/12/2011 Document Reviewed: 05/23/2009 [facility] Patient Information 2014 [place], [place].

## 2013-07-06 ENCOUNTER — Other Ambulatory Visit: Payer: Self-pay | Admitting: Internal Medicine

## 2013-07-06 DIAGNOSIS — Z1231 Encounter for screening mammogram for malignant neoplasm of breast: Secondary | ICD-10-CM

## 2013-07-19 ENCOUNTER — Ambulatory Visit (INDEPENDENT_AMBULATORY_CARE_PROVIDER_SITE_OTHER): Payer: Medicare Other | Admitting: Internal Medicine

## 2013-07-19 ENCOUNTER — Encounter: Payer: Self-pay | Admitting: Internal Medicine

## 2013-07-19 VITALS — BP 146/102 | HR 76 | Temp 97.5°F | Ht 64.0 in | Wt 187.4 lb

## 2013-07-19 DIAGNOSIS — Z0000 Encounter for general adult medical examination without abnormal findings: Secondary | ICD-10-CM

## 2013-07-19 DIAGNOSIS — Z23 Encounter for immunization: Secondary | ICD-10-CM

## 2013-07-19 DIAGNOSIS — I10 Essential (primary) hypertension: Secondary | ICD-10-CM

## 2013-07-19 NOTE — Progress Notes (Signed)
Pre visit review using our clinic review tool, if applicable. No additional management support is needed unless otherwise documented below in the visit note. 

## 2013-07-19 NOTE — Assessment & Plan Note (Addendum)
BP: 10/10/12: 134/85 04/20/13: 161/81 07/19/13: 146/102  Patient currently on HCTZ 12.5 mg qd. Started patient on Lisinopril 10 mg qd. Patient to return in 3 - 4 weeks for BP check and to see how she is tolerating lisinopril. If she is tolerating lisinopril well, she will be switched to a HCTZ-lisinopril combination pill.  Check BMP when she returns.

## 2013-07-19 NOTE — Progress Notes (Signed)
Subjective:     Patient ID: [name], female   DOB: [birth year], 67 y.o.   MRN: [id number]  HPI [name] is a 67 yo with PMH of HTN and endometrial adenocarcinoma who presents today for annual Medicare wellness examination and management of other chronic and acute problems.   The risk factors are reflected in the social history.  The roster of all physicians providing medical care to patient - is listed in the Snapshot section of the chart.  Activities of daily living:  The patient is 100% inedpendent in all ADLs: dressing, toileting, feeding as well as independent mobility  Home safety : The patient has smoke detectors in the home. They wear seatbelts.No firearms at home. There is no violence in the home.   There is no risks for hepatitis, STDs or HIV. There is no history of blood transfusion. They have no travel history to infectious disease endemic areas of the world.  The patient has seen their dentist this month. They have  seen their eye doctor in the past month. She has cataracts present and she is going to have surgery in January. They deny  any hearing difficulty and have not had audiologic testing in the last year.  They do not  have excessive sun exposure. Discussed the need for sun protection: hats, long sleeves and use of sunscreen if there is significant sun exposure.   Diet: the importance of a healthy diet is discussed. She tries to have a healthy diet. She has been eating more salads recently. She regularly eats vegetables and grapes. She tries to stay away from fried and fat food. She eats a lot of red meat, fish and chicken.  The patient does not have a regular exercise program. She is trying to start walking 3x a week for 30 minutes.  The benefits of regular aerobic exercise were discussed.  Depression screen: there are no signs or vegative symptoms of depression- irritability, change in appetite, anhedonia, sadness/tearfullness.  Cognitive assessment: the patient  manages all their financial and personal affairs and is actively engaged. They could relate day,date,year and events; recalled 3/3 objects at 3 minutes; performed clock-face test normally.  The following portions of the patient's history were reviewed and updated as appropriate: allergies, current medications, past family history, past medical history,  past surgical history, past social history  and problem list.  Vision, hearing, body mass index were assessed and reviewed.   During the course of the visit the patient was educated and counseled about appropriate screening and preventive services including : fall prevention , diabetes screening, nutrition counseling, colorectal cancer screening, and recommended immunizations.  Last colonoscopy was in 2007. She is due back in 2017. She recently saw her gynecologist earlier this year. Her mammogram in January 2014 was normal. She has another mammogram in January 2015. She had a flu shot in October.  Past Medical History  Diagnosis Date  . Hypertension   . GERD (gastroesophageal reflux disease)     On no meds  . Post-menopausal bleeding 02/18/2011  . Bone spur R foot    2014   Past Surgical History  Procedure Laterality Date  . Colonoscopy  2007  . Bone spur removed from left foot  2006  . Hysteroscopy w/d&c  02/18/2011    Procedure: DILATATION AND CURETTAGE (D&C) /HYSTEROSCOPY;  Surgeon: Kathreen Cosier, MD;  Location: [facility];  Service: Gynecology;  Laterality: N/A;  . Robotic assisted lap vaginal hysterectomy  03/31/11    hyst with  SBO due to endometrial cancer   Family History  Problem Relation Age of Onset  . Hyperlipidemia Mother   . Hypertension Mother   . Cancer Father 20    lung cancer  . Hypertension Brother    History   Social History  . Marital Status: Married    Spouse Name: N/A    Number of Children: N/A  . Years of Education: N/A   Occupational History  . Not on file.   Social History Main Topics  . Smoking  status: Never Smoker   . Smokeless tobacco: Never Used  . Alcohol Use: 1.2 oz/week    2 Cans of beer per week  . Drug Use: No  . Sexual Activity: Yes    Birth Control/ Protection: Post-menopausal   Other Topics Concern  . Not on file   Social History Narrative   HSG- [name]   Married 1987, 1 year divorced; remarried 2003   1 son 76   Work: Geophysicist/field seismologist.          Current Outpatient Prescriptions on File Prior to Visit  Medication Sig Dispense Refill  . AFLURIA PRESERVATIVE FREE injection       . Fish Oil OIL Take 1 tablet by mouth daily. Sundown Natural chewables       . Garlic 500 MG CAPS Take by mouth.      . hydrochlorothiazide (MICROZIDE) 12.5 MG capsule TAKE ONE CAPSULE BY MOUTH EVERY DAY  90 capsule  1  . ibuprofen (ADVIL,MOTRIN) 200 MG tablet Take 200 mg by mouth 2 (two) times daily. As needed for pain       . Multiple Vitamins-Minerals (CENTRUM SILVER ULTRA WOMENS PO) Take 1 tablet by mouth every morning.         No current facility-administered medications on file prior to visit.     Review of Systems  Constitutional: Negative for fatigue and unexpected weight change.  HENT: Negative for congestion, ear pain, hearing loss, sore throat and tinnitus.   Eyes: Negative for pain and discharge.  Respiratory: Negative for cough, chest tightness and shortness of breath.   Cardiovascular: Negative for chest pain and leg swelling.  Gastrointestinal: Negative for vomiting, abdominal pain and blood in stool.  Endocrine: Negative for cold intolerance, polydipsia, polyphagia and polyuria.  Genitourinary: Negative for dysuria, frequency and difficulty urinating.  Neurological: Negative for weakness, light-headedness, numbness and headaches.       Objective:   Physical Exam  Constitutional: She is oriented to person, place, and time. She appears well-developed.  HENT:  Head: Normocephalic and atraumatic.  Right Ear: External ear normal.  Left Ear:  External ear normal.  Nose: Nose normal.  Eyes: Conjunctivae and EOM are normal. Pupils are equal, round, and reactive to light.  Neck: Neck supple. No JVD present. No thyromegaly present.  Cardiovascular: Normal rate, regular rhythm, normal heart sounds and intact distal pulses.  Exam reveals no gallop and no friction rub.   No murmur heard. Pulmonary/Chest: Effort normal and breath sounds normal. She has no wheezes. She has no rales.  Abdominal: Soft. She exhibits no distension. There is no tenderness. There is no rebound and no guarding.  Musculoskeletal: She exhibits no edema.  Lymphadenopathy:    She has no cervical adenopathy.  Neurological: She is alert and oriented to person, place, and time. She has normal reflexes.  Skin: Skin is warm. No rash noted.  Pelvic Exam: Deferred to gyn Breast Exam: Deferred to gyn     Assessment  and Plan:    Ms. Dam is a 67 yo with PMH of HTN and endometrial adenocarcinoma who presents today for annual Medicare wellness examination and management of other chronic and acute problems.  1) HTN: Start patient on lisinopril 10 mg qday in addition to HCTZ 12.5 mg qd. Patient to return for check in 3 - 4 weeks. Check chemistry panel at this time.    2) Health Maintenance: Check Cholesterol panel when she returns in 3 - 4 weeks. She received a prevnar vaccine today. She was advised to ask if her insurance covers the shingles vaccine.

## 2013-07-19 NOTE — Assessment & Plan Note (Signed)
Patient is due for a lipid panel check. Her last lipid panel was in 2010. She will have her lipids checked when she returns in 3 - 4 weeks.

## 2013-07-19 NOTE — Patient Instructions (Addendum)
Pleasure seeing you today [name]! Hope you continue to feel well and that your husband's arm heals.   1) Your blood pressure has been running high the last visits in the office. Today your BP was 146/102. We would like your blood pressure to be less than 140/90. We are going to start a drug called an ACE inhibitor called lisinopril 10 mg once a day. Please continue to take your hydrochlorothiazide as prescribed. You can take both pills at the same time. The most common side effect from lisinopril is a cough. If you develop this and the cough bothers you, we will switch you to a different drug called an ARB (angiotensin receptor blocker).  If you tolerate lisinopril well, we can put you on a single pill with both drugs (hydrochlorothiazide and lisinopril).   2) We will see you back in about 3 - 4 weeks to see how well you are tolerating the drug and to check your blood pressure. Before you return for your visit, please try to go to the lab two days before your appointment and have lab work done if you can. The lab is open from 730 to 530 M-F. You can have a light meal before they check your labs.  We will check your chemistries, liver function, cholesterol and kidney function.   3) You received a pneumoniae vaccine today called prevnar.   4) Check if your insurance covers the shingles vaccine because the vaccine is expensive (~$300).

## 2013-07-20 ENCOUNTER — Telehealth: Payer: Self-pay | Admitting: Internal Medicine

## 2013-07-20 NOTE — Telephone Encounter (Signed)
Pt was seen yesterday.  She thought something was going to be called in for her blood pressure.  A new medication.  Her pharmacy is [facility] on [address]

## 2013-07-21 MED ORDER — LISINOPRIL 10 MG PO TABS: 10.0000 mg | ORAL_TABLET | Freq: Every day | ORAL | Status: DC

## 2013-07-21 NOTE — Telephone Encounter (Signed)
Rx sent 

## 2013-07-21 NOTE — Telephone Encounter (Signed)
Tried to call patient. No voice mail and no answer.

## 2013-07-25 NOTE — Telephone Encounter (Signed)
No answer and no vm

## 2013-08-14 ENCOUNTER — Other Ambulatory Visit: Payer: Self-pay | Admitting: Internal Medicine

## 2013-08-14 ENCOUNTER — Ambulatory Visit (HOSPITAL_COMMUNITY)
Admission: RE | Admit: 2013-08-14 | Discharge: 2013-08-14 | Disposition: A | Discharge status: Home / Self Care | Payer: Medicare Other | Source: Ambulatory Visit | Attending: Internal Medicine | Admitting: Internal Medicine

## 2013-08-14 ENCOUNTER — Other Ambulatory Visit: Payer: Medicare Other

## 2013-08-14 DIAGNOSIS — I10 Essential (primary) hypertension: Secondary | ICD-10-CM

## 2013-08-14 DIAGNOSIS — Z1231 Encounter for screening mammogram for malignant neoplasm of breast: Secondary | ICD-10-CM | POA: Insufficient documentation

## 2013-08-14 LAB — BASIC METABOLIC PANEL
BUN: 17 mg/dL (ref 6–23)
CHLORIDE: 107 meq/L (ref 96–112)
CO2: 26 mEq/L (ref 19–32)
Calcium: 9.4 mg/dL (ref 8.4–10.5)
Creatinine, Ser: 0.9 mg/dL (ref 0.4–1.2)
GFR: 85.87 mL/min (ref >60.00)
Glucose, Bld: 99 mg/dL (ref 70–99)
POTASSIUM: 4.3 meq/L (ref 3.5–5.1)
Sodium: 139 mEq/L (ref 135–145)

## 2013-08-16 ENCOUNTER — Encounter: Payer: Self-pay | Admitting: Internal Medicine

## 2013-08-16 ENCOUNTER — Ambulatory Visit (INDEPENDENT_AMBULATORY_CARE_PROVIDER_SITE_OTHER): Payer: Medicare Other | Admitting: Internal Medicine

## 2013-08-16 VITALS — BP 150/88 | HR 57 | Temp 97.1°F | Wt 190.8 lb

## 2013-08-16 DIAGNOSIS — I10 Essential (primary) hypertension: Secondary | ICD-10-CM

## 2013-08-16 MED ORDER — LISINOPRIL-HYDROCHLOROTHIAZIDE 10-12.5 MG PO TABS: 1.0000 | ORAL_TABLET | Freq: Every day | ORAL | Status: DC

## 2013-08-16 NOTE — Progress Notes (Signed)
   Subjective:    Patient ID: [name], female    DOB: [birth year], 68 y.o.   MRN: [id number]  HPI Ms. Pralle presents for follow up of HTN. She has had medication adjustment - see last note and med list.She has done well with no drug problems. She reports that her BP is much better at home - more in the controlled range. She has had no symptoms.  PMH, FamHx and SocHx reviewed for any changes and relevance. Current Outpatient Prescriptions on File Prior to Visit  Medication Sig Dispense Refill  . AFLURIA PRESERVATIVE FREE injection       . Fish Oil OIL Take 1 tablet by mouth daily. Sundown Natural chewables       . Garlic 914 MG CAPS Take by mouth.      Marland Kitchen ibuprofen (ADVIL,MOTRIN) 200 MG tablet Take 200 mg by mouth 2 (two) times daily. As needed for pain       . Multiple Vitamins-Minerals (CENTRUM SILVER ULTRA WOMENS PO) Take 1 tablet by mouth every morning.         No current facility-administered medications on file prior to visit.      Review of Systems System review is negative for any constitutional, cardiac, pulmonary, GI or neuro symptoms or complaints other than as described in the HPI.     Objective:   Physical Exam Filed Vitals:   08/16/13 0943  BP: 150/88  Pulse: 57  Temp: 97.1 F (36.2 C)   BP Readings from Last 3 Encounters:  08/16/13 150/88  07/19/13 146/102  04/20/13 161/81   Cor - RRR Neuro - A&O       Assessment & Plan:

## 2013-08-16 NOTE — Assessment & Plan Note (Signed)
Blood pressure control - your reading today is a little elevated but your home[facility] reading was great.  Plan Continue present medications  Please check your blood pressure several times a week for the next two weeks and get that information to me: MyChart, mail, call in to my assistant.  Will make recommendations about medication based on the outside readings.

## 2013-08-16 NOTE — Patient Instructions (Signed)
Blood pressure control - your reading today is a little elevated but your [facility] reading was great.  Plan Continue present medications  Please check your blood pressure several times a week for the next two weeks and get that information to me: MyChart, mail, call in to my assistant.  Will make recommendations about medication based on the outside readings.  

## 2013-08-16 NOTE — Progress Notes (Signed)
Pre visit review using our clinic review tool, if applicable. No additional management support is needed unless otherwise documented below in the visit note. 

## 2013-08-20 ENCOUNTER — Encounter: Payer: Self-pay | Admitting: Internal Medicine

## 2013-09-07 ENCOUNTER — Encounter: Payer: Self-pay | Admitting: Internal Medicine

## 2013-10-30 ENCOUNTER — Ambulatory Visit (INDEPENDENT_AMBULATORY_CARE_PROVIDER_SITE_OTHER): Payer: Medicare Other | Admitting: Obstetrics & Gynecology

## 2013-10-30 ENCOUNTER — Encounter: Payer: Self-pay | Admitting: Obstetrics & Gynecology

## 2013-10-30 VITALS — BP 138/90 | HR 75 | Ht 64.5 in | Wt 190.8 lb

## 2013-10-30 DIAGNOSIS — Z01419 Encounter for gynecological examination (general) (routine) without abnormal findings: Secondary | ICD-10-CM

## 2013-10-30 NOTE — Progress Notes (Signed)
Patient ID: [name], female   DOB: 04/18/1946, 68 y.o.   MRN: [id number] Subjective:     TASHAWNA THOM is a 68 y.o. female here for a routine exam.  Current complaints: none.    Gynecologic History No LMP recorded. Patient has had a hysterectomy. Contraception: status post hysterectomy Last Pap: 2009. Results were: s/p hyst for adenocarcinoma of the endometrium Last mammogram: Jan. Results were: normal  Obstetric History OB History  Gravida Para Term Preterm AB SAB TAB Ectopic Multiple Living  1 1 1   0 0    1    # Outcome Date GA Lbr Len/2nd Weight Sex Delivery Anes PTL Lv  1 TRM 08/12/65    M SVD   Y       The following portions of the patient's history were reviewed and updated as appropriate: allergies, current medications, past family history, past medical history, past social history, past surgical history and problem list.  Review of Systems A comprehensive review of systems was negative.    Objective:    BP 138/90  Pulse 75  Ht 5' 4.5" (1.638 m)  Wt 190 lb 12.8 oz (86.546 kg)  BMI 32.26 kg/m2  General Appearance:    Alert, cooperative, no distress, appears stated age                 Neck:   Supple, symmetrical, trachea midline, no adenopathy;    thyroid:  no enlargement/tenderness/nodules; no carotid   bruit or JVD  Back:     Symmetric, no curvature, ROM normal, no CVA tenderness  Lungs:     Clear to auscultation bilaterally, respirations unlabored  Chest Wall:    No tenderness or deformity   Heart:    Regular rate and rhythm, S1 and S2 normal, no murmur, rub   or gallop  Breast Exam:    No tenderness, masses, or nipple abnormality; no skin chnages  Abdomen:     Soft, non-tender, bowel sounds active all four quadrants,    no masses, no organomegaly  Genitalia:    Normal female without lesion, discharge or tenderness- uterus and cervix surgically removed- vaginal cuff without lesions.      Extremities:   Extremities normal, atraumatic, no cyanosis or edema   Pulses:   2+ and symmetric all extremities  Skin:   Skin color, texture, turgor normal, no rashes or lesions  Lymph nodes:   Cervical, supraclavicular, and axillary nodes normal         Assessment:    Healthy female exam.  H/o adenocarcinoma of the endometrium.  No chemo or radiation needed  Plan:    Follow up in: 6 months.   Exercise daily

## 2013-10-30 NOTE — Progress Notes (Signed)
Pt here for annual. Last mammogram in January.

## 2013-10-30 NOTE — Patient Instructions (Signed)
Exercise to Stay Healthy Exercise helps you become and stay healthy. EXERCISE IDEAS AND TIPS Choose exercises that:  You enjoy.  Fit into your day. You do not need to exercise really hard to be healthy. You can do exercises at a slow or medium level and stay healthy. You can:  Stretch before and after working out.  Try yoga, Pilates, or tai chi.  Lift weights.  Walk fast, swim, jog, run, climb stairs, bicycle, dance, or rollerskate.  Take aerobic classes. Exercises that burn about 150 calories:  Running 1  miles in 15 minutes.  Playing volleyball for 45 to 60 minutes.  Washing and waxing a car for 45 to 60 minutes.  Playing touch football for 45 minutes.  Walking 1  miles in 35 minutes.  Pushing a stroller 1  miles in 30 minutes.  Playing basketball for 30 minutes.  Raking leaves for 30 minutes.  Bicycling 5 miles in 30 minutes.  Walking 2 miles in 30 minutes.  Dancing for 30 minutes.  Shoveling snow for 15 minutes.  Swimming laps for 20 minutes.  Walking up stairs for 15 minutes.  Bicycling 4 miles in 15 minutes.  Gardening for 30 to 45 minutes.  Jumping rope for 15 minutes.  Washing windows or floors for 45 to 60 minutes. Document Released: 08/22/2010 Document Revised: 10/12/2011 Document Reviewed: 08/22/2010 [employer] Patient Information 2014 [employer].  

## 2014-03-28 ENCOUNTER — Encounter: Payer: Self-pay | Admitting: Podiatry

## 2014-03-28 ENCOUNTER — Ambulatory Visit (INDEPENDENT_AMBULATORY_CARE_PROVIDER_SITE_OTHER): Payer: Medicare Other | Admitting: Podiatry

## 2014-03-28 VITALS — BP 172/86 | HR 63 | Ht 64.0 in | Wt 185.0 lb

## 2014-03-28 DIAGNOSIS — M21969 Unspecified acquired deformity of unspecified lower leg: Secondary | ICD-10-CM

## 2014-03-28 DIAGNOSIS — M722 Plantar fascial fibromatosis: Secondary | ICD-10-CM

## 2014-03-28 DIAGNOSIS — M898X9 Other specified disorders of bone, unspecified site: Secondary | ICD-10-CM

## 2014-03-28 DIAGNOSIS — M21619 Bunion of unspecified foot: Secondary | ICD-10-CM

## 2014-03-28 DIAGNOSIS — M2010 Hallux valgus (acquired), unspecified foot: Secondary | ICD-10-CM

## 2014-03-28 NOTE — Progress Notes (Signed)
Subjective: 68 year old female presents requesting to have custom orthotics. She had them very long time and been worn out, and need replacement. 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 Orthotics do help her at work.  Objective: Severe HAV with bunion bilateral. Hypermobile first ray bilateral. Pain right plantar heel. Radiographic examination reveal severe hallux abductus, enlarged medial eminence of first Metatarsal, positive of subungual exostosis both great toes, positive of plantar calcaneal spur bilateral, and osteoporotic bones.  Assessment: Plantar fasciitis right. HAV with bunion bilateral. Hypermobile first ray bilateral.  Plan: Both feet casted for orthotics.

## 2014-03-28 NOTE — Patient Instructions (Signed)
Casted for orthotics for right heel pain. Will contact patient when they are ready.

## 2014-04-16 ENCOUNTER — Encounter: Payer: Self-pay | Admitting: Obstetrics & Gynecology

## 2014-04-16 ENCOUNTER — Ambulatory Visit (INDEPENDENT_AMBULATORY_CARE_PROVIDER_SITE_OTHER): Payer: Medicare Other | Admitting: Obstetrics & Gynecology

## 2014-04-16 VITALS — BP 140/86 | HR 75 | Temp 98.4°F | Ht 64.0 in | Wt 194.2 lb

## 2014-04-16 DIAGNOSIS — C541 Malignant neoplasm of endometrium: Secondary | ICD-10-CM

## 2014-04-16 DIAGNOSIS — C549 Malignant neoplasm of corpus uteri, unspecified: Secondary | ICD-10-CM

## 2014-04-16 NOTE — Progress Notes (Signed)
Subjective:     Patient ID: [name], female   DOB: Oct 31, 1945, 68 y.o.   MRN: [id number]  HPI Pt with a h/o adenocarcinoma here for here biannual visit. She denies problems.  She reports that her primary care physician has retried and she wants a new one      Review of Systems     Objective:   Physical Exam BP 140/86  Pulse 75  Temp(Src) 98.4 F (36.9 C) (Oral)  Ht 5\' 4"  (1.626 m)  Wt 194 lb 3.2 oz (88.089 kg)  BMI 33.32 kg/m2 Lungs: CTA CV: RRR GU: EGBUS: no lesions Vagina: no blood in vault Cervix/ Uterus: surgically absent Adnexa: no masses; non tender  Rectal: no masses        Assessment:     H/o adenocarcinoma for 6 moth check- doing well     Plan:     F/u 6 months referral to primary care

## 2014-04-16 NOTE — Patient Instructions (Addendum)
Uterine Cancer Uterine cancer is an abnormal growth of tissue (tumor) in the uterus that is cancerous (malignant). Unlike noncancerous (benign) tumors, malignant tumors can spread to other parts of your body. The wall of the uterus has two layers of tissue. The inner layer is the endometrium. The outer layer of muscle tissue is the myometrium. The most common type of uterine cancer begins in the endometrium. This is called endometrial cancer. Cancer that begins in the myometrium is called uterine sarcoma, which is very rare.  RISK FACTORS  Although the exact cause of uterine cancer is unknown, there are a number of risk factors that can increase your chances of getting uterine cancer. They include:  Your age. Uterine cancer occurs mostly in women older than 50 years.   Having an enlarged endometrium (endometrial hyperplasia).   Using hormone therapy.   Obesity.   Taking the drug tamoxifen.   White race.   Infertility.   Never being pregnant.   Beginning menstrual periods at an age younger than 12 years.   Having menstrual periods at an age older than 5 years.   Personal history of ovarian, intestinal, or colorectal cancer.   Having a family history of uterine cancer.   Having a family history of hereditary nonpolyposis colon cancer (HNPCC).   Having diabetes, high blood pressure, thyroid disease, or gallbladder disease.   Long-term use of high-dose birth control pills.   Exposure to radiation.   Smoking.  SIGNS AND SYMPTOMS   Abnormal vaginal bleeding or discharge. Bleeding may start as a watery, blood-streaked flow that gradually contains more blood.   Any vaginal bleeding after menopause.   Difficult or painful urination.   Pain during intercourse.   Pain in the pelvic area.  Mass in the vagina.  Pain or fullness in the abdomen.  Frequent urination.  Bleeding between periods.  Growth of the stomach.   Unexplained weight loss.   Uterine cancer usually occurs after menopause. However, it may also occur around the time that menopause begins. Abnormal vaginal bleeding is the most common symptom of uterine cancer. Women should not assume that abnormal vaginal bleeding is part of menopause. DIAGNOSIS  Your health care provider will ask about your medical history. He or she may also perform a number of procedures, such as:  A physical and pelvic exam. Your health care provider will feel your pelvis for any lumps.   Blood and urine tests.   X-rays.   Imaging tests, such as CT scans, ultrasonography, or MRIs.   A hysteroscopy to view the inside of your uterus.   A Pap test to sample cells from the cervix and upper vagina to check for abnormal cells.   Taking a tissue sample (biopsy) from the uterine lining to look for cancer cells.   A dilation and curettage (D&C). This involves stretching (dilation) the cervix and scraping (curettage) the inside lining of the uterus to get a tissue sample. The sample is examined under a microscope to look for cancer cells.  Your cancer will be staged to determine its severity and extent. Staging is a careful attempt to find out the size of the tumor, whether the cancer has spread, and if so, to what parts of the body. You may need to have more tests to determine the stage of your cancer. The test results will help determine what treatment plan is best for you. Cancer stages include:   Stage I. The cancer is only found in the uterus.  Stage II. The  cancer has spread to the cervix.  Stage III. The cancer has spread outside the uterus, but not outside the pelvis. The cancer may have spread to the lymph nodes in the pelvis.  Stage IV. The cancer has spread to other parts of the body, such as the bladder or rectum. TREATMENT  Most women with uterine cancer are treated with surgery. This includes removing the uterus, cervix, fallopian tubes, and ovaries (total hysterectomy). Your  lymph nodes near the tumor may also be removed. Some women have radiation, chemotherapy, or hormonal therapy. Other women have a combination of these therapies. HOME CARE INSTRUCTIONS   Take medicines only as directed by your health care provider.   Maintain a healthy diet.  Exercise regularly.   If you have diabetes, high blood pressure, thyroid disease, or gallbladder disease, follow your health care provider's instructions to keep it under control.   Do not smoke.   Consider joining a support group. This may help you learn to cope with the stress of having uterine cancer.   Seek advice to help you manage treatment side effects.   Keep all follow-up visits as directed by your health care provider.  SEEK MEDICAL CARE IF:  You have increased stomach or pelvic pain.  You cannot urinate.  You have abnormal bleeding. Document Released: 07/20/2005 Document Revised: 12/04/2013 Document Reviewed: 01/06/2013 [facility] Patient Information 2015 [place], [place]. This information is not intended to replace advice given to you by your health care provider. Make sure you discuss any questions you have with your health care provider.    Rec for primary care:  Dr. Glendale Chard  [phone]  Dr. Ria Bush  [phone]

## 2014-05-23 ENCOUNTER — Encounter: Payer: Self-pay | Admitting: Podiatry

## 2014-05-23 ENCOUNTER — Ambulatory Visit (INDEPENDENT_AMBULATORY_CARE_PROVIDER_SITE_OTHER): Payer: Medicare Other | Admitting: Podiatry

## 2014-05-23 DIAGNOSIS — M722 Plantar fascial fibromatosis: Secondary | ICD-10-CM

## 2014-05-23 DIAGNOSIS — M79604 Pain in right leg: Secondary | ICD-10-CM

## 2014-05-23 DIAGNOSIS — M79605 Pain in left leg: Secondary | ICD-10-CM

## 2014-05-23 DIAGNOSIS — M79606 Pain in leg, unspecified: Secondary | ICD-10-CM | POA: Insufficient documentation

## 2014-05-23 NOTE — Progress Notes (Signed)
One month follow up on orthotics. They are helping but still having morning discomfort when getting out of bed.  Objective:. Left heel pain. Severe HAV with bunion bilateral. Hypermobile first ray bilateral.  Assessment: Improving plantar fasciitis left. Morning discomfort still remain.   Plan: Discussed the findings.  May benefit from Night Splint.

## 2014-05-23 NOTE — Patient Instructions (Signed)
Orthotics are helping. Night splint may help for the morning heel pain. Return as needed.

## 2014-06-04 ENCOUNTER — Encounter: Payer: Self-pay | Admitting: Podiatry

## 2014-07-16 ENCOUNTER — Other Ambulatory Visit: Payer: Self-pay | Admitting: Internal Medicine

## 2014-07-16 DIAGNOSIS — Z1231 Encounter for screening mammogram for malignant neoplasm of breast: Secondary | ICD-10-CM

## 2014-07-30 ENCOUNTER — Encounter: Payer: Medicare Other | Admitting: Internal Medicine

## 2014-08-13 ENCOUNTER — Other Ambulatory Visit (INDEPENDENT_AMBULATORY_CARE_PROVIDER_SITE_OTHER): Payer: Commercial Managed Care - HMO

## 2014-08-13 ENCOUNTER — Encounter: Payer: Self-pay | Admitting: Internal Medicine

## 2014-08-13 ENCOUNTER — Ambulatory Visit (INDEPENDENT_AMBULATORY_CARE_PROVIDER_SITE_OTHER): Payer: Commercial Managed Care - HMO | Admitting: Internal Medicine

## 2014-08-13 VITALS — BP 142/90 | HR 62 | Temp 97.9°F | Resp 18 | Ht 64.0 in | Wt 194.4 lb

## 2014-08-13 DIAGNOSIS — R7309 Other abnormal glucose: Secondary | ICD-10-CM | POA: Diagnosis not present

## 2014-08-13 DIAGNOSIS — I10 Essential (primary) hypertension: Secondary | ICD-10-CM

## 2014-08-13 DIAGNOSIS — Z13220 Encounter for screening for lipoid disorders: Secondary | ICD-10-CM

## 2014-08-13 DIAGNOSIS — R42 Dizziness and giddiness: Secondary | ICD-10-CM

## 2014-08-13 DIAGNOSIS — R739 Hyperglycemia, unspecified: Secondary | ICD-10-CM

## 2014-08-13 LAB — LIPID PANEL
CHOL/HDL RATIO: 3
Cholesterol: 203 mg/dL — ABNORMAL HIGH (ref 0–200)
HDL: 78.2 mg/dL (ref >39.00)
LDL Cholesterol: 114 mg/dL — ABNORMAL HIGH (ref 0–99)
NonHDL: 124.8
Triglycerides: 56 mg/dL (ref 0.0–149.0)
VLDL: 11.2 mg/dL (ref 0.0–40.0)

## 2014-08-13 LAB — BASIC METABOLIC PANEL WITH GFR
BUN: 16 mg/dL (ref 6–23)
CO2: 25 meq/L (ref 19–32)
Calcium: 9.6 mg/dL (ref 8.4–10.5)
Chloride: 104 meq/L (ref 96–112)
Creatinine, Ser: 0.9 mg/dL (ref 0.4–1.2)
GFR: 83.35 mL/min (ref >60.00)
Glucose, Bld: 97 mg/dL (ref 70–99)
Potassium: 4.9 meq/L (ref 3.5–5.1)
Sodium: 138 meq/L (ref 135–145)

## 2014-08-13 LAB — HEMOGLOBIN A1C: HEMOGLOBIN A1C: 6.1 % (ref 4.6–6.5)

## 2014-08-13 MED ORDER — LISINOPRIL-HYDROCHLOROTHIAZIDE 10-12.5 MG PO TABS: 1.0000 | ORAL_TABLET | Freq: Every day | ORAL | Status: DC

## 2014-08-13 NOTE — Patient Instructions (Signed)
We will check your blood work today to make sure that they are no problems with your electrolytes or sugars. We will call you back with the results.  We have sent in your refills of your medicines. Think about getting a bone density scan next year to make sure your bones are as strong as they should be.  Work on exercising by doing silver sneakers or walking about 3 days per week to help keep your body healthy.  Come back in about 1 year for a check up. If you have any new problems or questions before then please feel free to call our office.   Health Maintenance Adopting a healthy lifestyle and getting preventive care can go a long way to promote health and wellness. Talk with your health care provider about what schedule of regular examinations is right for you. This is a good chance for you to check in with your provider about disease prevention and staying healthy. In between checkups, there are plenty of things you can do on your own. Experts have done a lot of research about which lifestyle changes and preventive measures are most likely to keep you healthy. Ask your health care provider for more information. WEIGHT AND DIET  Eat a healthy diet  Be sure to include plenty of vegetables, fruits, low-fat dairy products, and lean protein.  Do not eat a lot of foods high in solid fats, added sugars, or salt.  Get regular exercise. This is one of the most important things you can do for your health.  Most adults should exercise for at least 150 minutes each week. The exercise should increase your heart rate and make you sweat (moderate-intensity exercise).  Most adults should also do strengthening exercises at least twice a week. This is in addition to the moderate-intensity exercise.  Maintain a healthy weight  Body mass index (BMI) is a measurement that can be used to identify possible weight problems. It estimates body fat based on height and weight. Your health care provider can help  determine your BMI and help you achieve or maintain a healthy weight.  For females 70 years of age and older:   A BMI below 18.5 is considered underweight.  A BMI of 18.5 to 24.9 is normal.  A BMI of 25 to 29.9 is considered overweight.  A BMI of 30 and above is considered obese.  Watch levels of cholesterol and blood lipids  You should start having your blood tested for lipids and cholesterol at 68 years of age, then have this test every 5 years.  You may need to have your cholesterol levels checked more often if:  Your lipid or cholesterol levels are high.  You are older than 68 years of age.  You are at high risk for heart disease.  CANCER SCREENING   Lung Cancer  Lung cancer screening is recommended for adults 8-38 years old who are at high risk for lung cancer because of a history of smoking.  A yearly low-dose CT scan of the lungs is recommended for people who:  Currently smoke.  Have quit within the past 15 years.  Have at least a 30-pack-year history of smoking. A pack year is smoking an average of one pack of cigarettes a day for 1 year.  Yearly screening should continue until it has been 15 years since you quit.  Yearly screening should stop if you develop a health problem that would prevent you from having lung cancer treatment.  Breast Cancer  Practice breast self-awareness. This means understanding how your breasts normally appear and feel.  It also means doing regular breast self-exams. Let your health care provider know about any changes, no matter how small.  If you are in your 20s or 30s, you should have a clinical breast exam (CBE) by a health care provider every 1-3 years as part of a regular health exam.  If you are 72 or older, have a CBE every year. Also consider having a breast X-ray (mammogram) every year.  If you have a family history of breast cancer, talk to your health care provider about genetic screening.  If you are at high risk  for breast cancer, talk to your health care provider about having an MRI and a mammogram every year.  Breast cancer gene (BRCA) assessment is recommended for women who have family members with BRCA-related cancers. BRCA-related cancers include:  Breast.  Ovarian.  Tubal.  Peritoneal cancers.  Results of the assessment will determine the need for genetic counseling and BRCA1 and BRCA2 testing. Cervical Cancer Routine pelvic examinations to screen for cervical cancer are no longer recommended for nonpregnant women who are considered low risk for cancer of the pelvic organs (ovaries, uterus, and vagina) and who do not have symptoms. A pelvic examination may be necessary if you have symptoms including those associated with pelvic infections. Ask your health care provider if a screening pelvic exam is right for you.   The Pap test is the screening test for cervical cancer for women who are considered at risk.  If you had a hysterectomy for a problem that was not cancer or a condition that could lead to cancer, then you no longer need Pap tests.  If you are older than 65 years, and you have had normal Pap tests for the past 10 years, you no longer need to have Pap tests.  If you have had past treatment for cervical cancer or a condition that could lead to cancer, you need Pap tests and screening for cancer for at least 20 years after your treatment.  If you no longer get a Pap test, assess your risk factors if they change (such as having a new sexual partner). This can affect whether you should start being screened again.  Some women have medical problems that increase their chance of getting cervical cancer. If this is the case for you, your health care provider may recommend more frequent screening and Pap tests.  The human papillomavirus (HPV) test is another test that may be used for cervical cancer screening. The HPV test looks for the virus that can cause cell changes in the cervix. The  cells collected during the Pap test can be tested for HPV.  The HPV test can be used to screen women 76 years of age and older. Getting tested for HPV can extend the interval between normal Pap tests from three to five years.  An HPV test also should be used to screen women of any age who have unclear Pap test results.  After 68 years of age, women should have HPV testing as often as Pap tests.  Colorectal Cancer  This type of cancer can be detected and often prevented.  Routine colorectal cancer screening usually begins at 68 years of age and continues through 68 years of age.  Your health care provider may recommend screening at an earlier age if you have risk factors for colon cancer.  Your health care provider may also recommend using home test kits  to check for hidden blood in the stool.  A small camera at the end of a tube can be used to examine your colon directly (sigmoidoscopy or colonoscopy). This is done to check for the earliest forms of colorectal cancer.  Routine screening usually begins at age 83.  Direct examination of the colon should be repeated every 5-10 years through 68 years of age. However, you may need to be screened more often if early forms of precancerous polyps or small growths are found. Skin Cancer  Check your skin from head to toe regularly.  Tell your health care provider about any new moles or changes in moles, especially if there is a change in a mole's shape or color.  Also tell your health care provider if you have a mole that is larger than the size of a pencil eraser.  Always use sunscreen. Apply sunscreen liberally and repeatedly throughout the day.  Protect yourself by wearing long sleeves, pants, a wide-brimmed hat, and sunglasses whenever you are outside. HEART DISEASE, DIABETES, AND HIGH BLOOD PRESSURE   Have your blood pressure checked at least every 1-2 years. High blood pressure causes heart disease and increases the risk of  stroke.  If you are between 33 years and 73 years old, ask your health care provider if you should take aspirin to prevent strokes.  Have regular diabetes screenings. This involves taking a blood sample to check your fasting blood sugar level.  If you are at a normal weight and have a low risk for diabetes, have this test once every three years after 68 years of age.  If you are overweight and have a high risk for diabetes, consider being tested at a younger age or more often. PREVENTING INFECTION  Hepatitis B  If you have a higher risk for hepatitis B, you should be screened for this virus. You are considered at high risk for hepatitis B if:  You were born in a country where hepatitis B is common. Ask your health care provider which countries are considered high risk.  Your parents were born in a high-risk country, and you have not been immunized against hepatitis B (hepatitis B vaccine).  You have HIV or AIDS.  You use needles to inject street drugs.  You live with someone who has hepatitis B.  You have had sex with someone who has hepatitis B.  You get hemodialysis treatment.  You take certain medicines for conditions, including cancer, organ transplantation, and autoimmune conditions. Hepatitis C  Blood testing is recommended for:  Everyone born from [birth year] through 1964.  Anyone with known risk factors for hepatitis C. Sexually transmitted infections (STIs)  You should be screened for sexually transmitted infections (STIs) including gonorrhea and chlamydia if:  You are sexually active and are younger than 68 years of age.  You are older than 68 years of age and your health care provider tells you that you are at risk for this type of infection.  Your sexual activity has changed since you were last screened and you are at an increased risk for chlamydia or gonorrhea. Ask your health care provider if you are at risk.  If you do not have HIV, but are at risk, it may be  recommended that you take a prescription medicine daily to prevent HIV infection. This is called pre-exposure prophylaxis (PrEP). You are considered at risk if:  You are sexually active and do not regularly use condoms or know the HIV status of your partner(s).  You  take drugs by injection.  You are sexually active with a partner who has HIV. Talk with your health care provider about whether you are at high risk of being infected with HIV. If you choose to begin PrEP, you should first be tested for HIV. You should then be tested every 3 months for as long as you are taking PrEP.  PREGNANCY   If you are premenopausal and you may become pregnant, ask your health care provider about preconception counseling.  If you may become pregnant, take 400 to 800 micrograms (mcg) of folic acid every day.  If you want to prevent pregnancy, talk to your health care provider about birth control (contraception). OSTEOPOROSIS AND MENOPAUSE   Osteoporosis is a disease in which the bones lose minerals and strength with aging. This can result in serious bone fractures. Your risk for osteoporosis can be identified using a bone density scan.  If you are 40 years of age or older, or if you are at risk for osteoporosis and fractures, ask your health care provider if you should be screened.  Ask your health care provider whether you should take a calcium or vitamin D supplement to lower your risk for osteoporosis.  Menopause may have certain physical symptoms and risks.  Hormone replacement therapy may reduce some of these symptoms and risks. Talk to your health care provider about whether hormone replacement therapy is right for you.  HOME CARE INSTRUCTIONS   Schedule regular health, dental, and eye exams.  Stay current with your immunizations.   Do not use any tobacco products including cigarettes, chewing tobacco, or electronic cigarettes.  If you are pregnant, do not drink alcohol.  If you are  breastfeeding, limit how much and how often you drink alcohol.  Limit alcohol intake to no more than 1 drink per day for nonpregnant women. One drink equals 12 ounces of beer, 5 ounces of wine, or 1 ounces of hard liquor.  Do not use street drugs.  Do not share needles.  Ask your health care provider for help if you need support or information about quitting drugs.  Tell your health care provider if you often feel depressed.  Tell your health care provider if you have ever been abused or do not feel safe at home. Document Released: 02/02/2011 Document Revised: 12/04/2013 Document Reviewed: 06/21/2013 [facility] Patient Information 2015 [place], [place]. This information is not intended to replace advice given to you by your health care provider. Make sure you discuss any questions you have with your health care provider.

## 2014-08-13 NOTE — Progress Notes (Signed)
Pre visit review using our clinic review tool, if applicable. No additional management support is needed unless otherwise documented below in the visit note. 

## 2014-08-14 NOTE — Progress Notes (Signed)
   Subjective:    Patient ID: [name], female    DOB: 1945/12/01, 69 y.o.   MRN: [id number]  HPI The patient is a 69 YO female who is coming in today to establish care. She has PMH of HTN. She is doing well but has noticed that she has had 2 episodes in the last month or two with dizziness. She has not noticed if standing or sitting seems to bring it on. She has not noticed if these were days when she has not eaten very much. She denies any chest pains, SOB, abdominal pains, GERD, constipation, diarrhea. She denies any problems with her joints right now. She denies falls and no problems with balance.   Review of Systems  Constitutional: Negative for fever, activity change, appetite change, fatigue and unexpected weight change.  HENT: Negative.   Respiratory: Negative for cough, chest tightness, shortness of breath and wheezing.   Cardiovascular: Negative for chest pain, palpitations and leg swelling.  Gastrointestinal: Negative for nausea, abdominal pain, diarrhea, constipation and abdominal distention.  Musculoskeletal: Negative.   Skin: Negative.   Neurological: Positive for dizziness and light-headedness. Negative for weakness, numbness and headaches.      Objective:   Physical Exam  Constitutional: She is oriented to person, place, and time. She appears well-developed and well-nourished.  HENT:  Head: Normocephalic and atraumatic.  Eyes: EOM are normal.  Neck: Normal range of motion.  Cardiovascular: Normal rate and regular rhythm.   Pulmonary/Chest: Breath sounds normal. No respiratory distress. She has no wheezes. She has no rales.  Abdominal: Soft. Bowel sounds are normal. She exhibits no distension. There is no tenderness. There is no rebound.  Musculoskeletal: She exhibits no edema.  Neurological: She is alert and oriented to person, place, and time. Coordination normal.  Skin: Skin is warm and dry.   Filed Vitals:   08/13/14 1101  BP: 142/90  Pulse: 62  Temp: 97.9 F  (36.6 C)  Resp: 18  Height: 5\' 4"  (1.626 m)  Weight: 194 lb 6.4 oz (88.179 kg)  SpO2: 97%      Assessment & Plan:

## 2014-08-14 NOTE — Assessment & Plan Note (Signed)
Unclear if this is related to hydration status and have asked her to pay attention to episodes and when they are happening. If they continue warrant further work up. At this time no orthostatic symptoms, check BMP for kidney and electrolytes.

## 2014-08-14 NOTE — Assessment & Plan Note (Signed)
BP has been normal in the past, will monitor and adjust as needed. Check BMP to ensure no abnormalities. Adjust as needed.

## 2014-08-15 ENCOUNTER — Ambulatory Visit (HOSPITAL_COMMUNITY)
Admission: RE | Admit: 2014-08-15 | Discharge: 2014-08-15 | Disposition: A | Discharge status: Home / Self Care | Payer: Commercial Managed Care - HMO | Source: Ambulatory Visit | Attending: Internal Medicine | Admitting: Internal Medicine

## 2014-08-15 DIAGNOSIS — Z1231 Encounter for screening mammogram for malignant neoplasm of breast: Secondary | ICD-10-CM | POA: Diagnosis not present

## 2014-10-08 ENCOUNTER — Encounter: Payer: Self-pay | Admitting: Obstetrics & Gynecology

## 2014-10-08 ENCOUNTER — Ambulatory Visit (INDEPENDENT_AMBULATORY_CARE_PROVIDER_SITE_OTHER): Payer: Commercial Managed Care - HMO | Admitting: Obstetrics & Gynecology

## 2014-10-08 VITALS — BP 140/72 | HR 71 | Ht 64.0 in | Wt 188.4 lb

## 2014-10-08 DIAGNOSIS — Z01419 Encounter for gynecological examination (general) (routine) without abnormal findings: Secondary | ICD-10-CM | POA: Diagnosis not present

## 2014-10-08 DIAGNOSIS — Z9189 Other specified personal risk factors, not elsewhere classified: Secondary | ICD-10-CM

## 2014-10-08 NOTE — Progress Notes (Signed)
Patient ID: [name], female   DOB: 1945/11/01, 69 y.o.   MRN: [id number] Subjective:     [name] is a 69 y.o. female here for a routine exam.  Current complaints: no problems.     Gynecologic History No LMP recorded. Patient has had a hysterectomy. Contraception: none Last mammogram: 08/15/2014. Results were: CLINICAL DATA: Screening.  EXAM: DIGITAL SCREENING BILATERAL MAMMOGRAM WITH 3D TOMO WITH CAD  COMPARISON: Previous exam(s).  ACR Breast Density Category b: There are scattered areas of fibroglandular density.  FINDINGS: There are no findings suspicious for malignancy. Images were processed with CAD.  IMPRESSION: No mammographic evidence of malignancy. A result letter of this screening mammogram will be mailed directly to the patient.  RECOMMENDATION: Screening mammogram in one year. (Code:[id number])  BI-RADS CATEGORY 1: Negative.  Obstetric History OB History  Gravida Para Term Preterm AB SAB TAB Ectopic Multiple Living  1 1 1   0 0    1    # Outcome Date GA Lbr Len/2nd Weight Sex Delivery Anes PTL Lv  1 Term 08/12/65    [place]     Past Medical History  Diagnosis Date  . Hypertension   . GERD (gastroesophageal reflux disease)     On no meds  . Post-menopausal bleeding 02/18/2011  . Bone spur R foot    2014  . Cataract    Past Surgical History  Procedure Laterality Date  . Colonoscopy  2007  . Bone spur removed from left foot  2006  . Hysteroscopy w/d&c  02/18/2011    Procedure: DILATATION AND CURETTAGE (D&C) /HYSTEROSCOPY;  Surgeon: Frederico Hamman, MD;  Location: [facility];  Service: Gynecology;  Laterality: N/A;  . Robotic assisted lap vaginal hysterectomy  03/31/11    hyst with SBO due to endometrial cancer  . Eye surgery      both eyes   Current Outpatient Prescriptions on File Prior to Visit  Medication Sig Dispense Refill  . Fish Oil OIL Take 1 tablet by mouth daily. Sundown Natural chewables     . Garlic 335 MG CAPS  Take by mouth.    Marland Kitchen ibuprofen (ADVIL,MOTRIN) 200 MG tablet Take 200 mg by mouth 2 (two) times daily. As needed for pain     . lisinopril-hydrochlorothiazide (PRINZIDE,ZESTORETIC) 10-12.5 MG per tablet Take 1 tablet by mouth daily. 90 tablet 3  . Multiple Vitamins-Minerals (CENTRUM SILVER ULTRA WOMENS PO) Take 1 tablet by mouth every morning.       No current facility-administered medications on file prior to visit.   Allergies  Allergen Reactions  . Codeine Rash    REACTION: RASH        The following portions of the patient's history were reviewed and updated as appropriate: allergies, current medications, past family history, past medical history, past social history, past surgical history and problem list.  Review of Systems A comprehensive review of systems was negative.    Objective:    BP 140/72 mmHg  Pulse 71  Ht 5\' 4"  (1.626 m)  Wt 188 lb 6.4 oz (85.458 kg)  BMI 32.32 kg/m2  General Appearance:    Alert, cooperative, no distress, appears stated age                                   Abdomen:     Soft, non-tender, bowel sounds active all four quadrants,    no masses, no  organomegaly  Genitalia:    Normal female without lesion, discharge or tenderness; no masses at cuff.  Adnexa: no masses       Extremities:   Extremities normal, atraumatic, no cyanosis or edema  Pulses:   2+ and symmetric all extremities  Skin:   Skin color, texture, turgor normal, no rashes or lesions            Assessment:    Healthy female exam.   H/o adenocarcinoma of the endometrium- doing well     Plan:    Follow up in: 6 months.

## 2015-04-05 ENCOUNTER — Ambulatory Visit: Payer: Commercial Managed Care - HMO | Admitting: Obstetrics & Gynecology

## 2015-04-22 ENCOUNTER — Ambulatory Visit: Payer: Commercial Managed Care - HMO | Admitting: Obstetrics & Gynecology

## 2015-04-26 ENCOUNTER — Ambulatory Visit: Payer: Commercial Managed Care - HMO | Admitting: Obstetrics & Gynecology

## 2015-05-08 ENCOUNTER — Ambulatory Visit (INDEPENDENT_AMBULATORY_CARE_PROVIDER_SITE_OTHER): Payer: Commercial Managed Care - HMO | Admitting: Obstetrics and Gynecology

## 2015-05-08 ENCOUNTER — Encounter: Payer: Self-pay | Admitting: Obstetrics and Gynecology

## 2015-05-08 VITALS — BP 153/74 | HR 68 | Temp 98.4°F | Ht 62.5 in | Wt 184.1 lb

## 2015-05-08 DIAGNOSIS — C541 Malignant neoplasm of endometrium: Secondary | ICD-10-CM | POA: Diagnosis not present

## 2015-05-08 DIAGNOSIS — N393 Stress incontinence (female) (male): Secondary | ICD-10-CM

## 2015-05-08 DIAGNOSIS — I10 Essential (primary) hypertension: Secondary | ICD-10-CM

## 2015-05-08 NOTE — Progress Notes (Signed)
CLINIC ENCOUNTER NOTE  History:  69 y.o. G1P1001 here today for bi-annual visit.  Grade 1 endometrial cancer in 2012, is s/p hysterectomy with b/l SBO. Denies bleeding, abdominal/pelvic pain. Saw gyn/onc through 2014. Followed for 2 years by gyn/onc. Has been told needs twice a year visits for 5 years. birads 1 mammo 08/2014, on a yearly schedule. Has colonoscopy due in 2017.  Uses ky with sex, no problems.  Occasional stress incontinence, a few times a month.  Past Medical History  Diagnosis Date  . Hypertension   . GERD (gastroesophageal reflux disease)     On no meds  . Post-menopausal bleeding 02/18/2011  . Bone spur R foot    2014  . Cataract     Past Surgical History  Procedure Laterality Date  . Colonoscopy  2007  . Bone spur removed from left foot  2006  . Hysteroscopy w/d&c  02/18/2011    Procedure: DILATATION AND CURETTAGE (D&C) /HYSTEROSCOPY;  Surgeon: Frederico Hamman, MD;  Location: [facility];  Service: Gynecology;  Laterality: N/A;  . Robotic assisted lap vaginal hysterectomy  03/31/11    hyst with SBO due to endometrial cancer  . Eye surgery      both eyes    The following portions of the patient's history were reviewed and updated as appropriate: allergies, current medications, past family history, past medical history, past social history, past surgical history and problem list.    Review of Systems:  See above; comprehensive review of systems was otherwise negative.  Objective:  Physical Exam BP 153/74 mmHg  Pulse 68  Temp(Src) 98.4 F (36.9 C)  Ht 5' 2.5" (1.588 m)  Wt 184 lb 1.6 oz (83.507 kg)  BMI 33.11 kg/m2 CONSTITUTIONAL: Well-developed, well-nourished female in no acute distress.  HENT:  Normocephalic, atraumatic SKIN: Skin is warm and dry.  [place]: Alert  PSYCHIATRIC: Normal mood and affect.  CARDIOVASCULAR: Normal heart rate noted RESPIRATORY: Effort and breath sounds normal, no problems with respiration noted ABDOMEN: Soft, no  distention noted.  No tenderness, rebound or guarding.  PELVIC: normal postmenopausal vaginal epithelium ending in blind cuff, no lesions, no tenderness on bimanual   Labs and Imaging No results found.  Assessment & Plan:   # History endometrial cancer s/p hysterectomy and bilateral salpingooophorectomy - normal exam today, asymptomatic, will continue twice yearly exams - continue yearly mammos, next scheduled for 08/2014  # Stress incontinence - Kegal instruction - f/u if no improvement  # HTN - mod elevation today, recommending PCP close f/u as will require lab work with med dose adjustments  Routine preventative health maintenance measures emphasized.     Noah B. Wouk, [place] for [employer], [place]

## 2015-07-18 ENCOUNTER — Other Ambulatory Visit: Payer: Self-pay

## 2015-07-18 DIAGNOSIS — Z1231 Encounter for screening mammogram for malignant neoplasm of breast: Secondary | ICD-10-CM

## 2015-08-15 ENCOUNTER — Encounter: Payer: Self-pay | Admitting: Internal Medicine

## 2015-08-15 ENCOUNTER — Ambulatory Visit (INDEPENDENT_AMBULATORY_CARE_PROVIDER_SITE_OTHER): Payer: Commercial Managed Care - HMO | Admitting: Internal Medicine

## 2015-08-15 ENCOUNTER — Telehealth: Payer: Self-pay | Admitting: *Deleted

## 2015-08-15 VITALS — BP 142/80 | HR 71 | Temp 98.3°F | Resp 16 | Wt 185.4 lb

## 2015-08-15 DIAGNOSIS — Z0001 Encounter for general adult medical examination with abnormal findings: Secondary | ICD-10-CM | POA: Diagnosis not present

## 2015-08-15 DIAGNOSIS — I10 Essential (primary) hypertension: Secondary | ICD-10-CM | POA: Diagnosis not present

## 2015-08-15 DIAGNOSIS — Z0000 Encounter for general adult medical examination without abnormal findings: Secondary | ICD-10-CM

## 2015-08-15 MED ORDER — LISINOPRIL-HYDROCHLOROTHIAZIDE 20-25 MG PO TABS: 1.0000 | ORAL_TABLET | Freq: Every day | ORAL | Status: DC

## 2015-08-15 NOTE — Telephone Encounter (Signed)
Received call pt states she saw md this am gave assistant wrong pharmacy. Instead of using [facility] [address] refills need to go to [address]. Resent to correct [facility]...Johny Chess

## 2015-08-15 NOTE — Progress Notes (Signed)
Pre visit review using our clinic review tool, if applicable. No additional management support is needed unless otherwise documented below in the visit note. 

## 2015-08-15 NOTE — Progress Notes (Signed)
   Subjective:    Patient ID: [name], female    DOB: [birth year], 70 y.o.   MRN: [id number]  HPI Here for medicare wellness, no new complaints. Please see A/P for status and treatment of chronic medical problems.   Diet: heart healthy  Physical activity: sedentary Depression/mood screen: negative Hearing: intact to whispered voice Visual acuity: grossly normal, performs annual eye exam  ADLs: capable Fall risk: none Home safety: good Cognitive evaluation: intact to orientation, naming, recall and repetition EOL planning: adv directives discussed  I have personally reviewed and have noted 1. The patient's medical and social history - reviewed today no changes 2. Their use of alcohol, tobacco or illicit drugs 3. Their current medications and supplements 4. The patient's functional ability including ADL's, fall risks, home safety risks and hearing or visual impairment. 5. Diet and physical activities 6. Evidence for depression or mood disorders 7. Care team reviewed and updated (available in snapshot)  Review of Systems  Constitutional: Negative for fever, activity change, appetite change, fatigue and unexpected weight change.  HENT: Negative.   Respiratory: Negative for cough, chest tightness, shortness of breath and wheezing.   Cardiovascular: Negative for chest pain, palpitations and leg swelling.  Gastrointestinal: Negative for nausea, abdominal pain, diarrhea, constipation and abdominal distention.  Musculoskeletal: Negative.   Skin: Negative.   Neurological: Negative for dizziness, weakness, light-headedness, numbness and headaches.      Objective:   Physical Exam  Constitutional: She is oriented to person, place, and time. She appears well-developed and well-nourished.  HENT:  Head: Normocephalic and atraumatic.  Eyes: EOM are normal.  Neck: Normal range of motion.  Cardiovascular: Normal rate and regular rhythm.   Pulmonary/Chest: Breath sounds normal. No  respiratory distress. She has no wheezes. She has no rales.  Abdominal: Soft. Bowel sounds are normal. She exhibits no distension. There is no tenderness. There is no rebound.  Musculoskeletal: She exhibits no edema.  Neurological: She is alert and oriented to person, place, and time. Coordination normal.  Skin: Skin is warm and dry.   Filed Vitals:   08/15/15 1027  BP: 160/100  Pulse: 71  Temp: 98.3 F (36.8 C)  TempSrc: Oral  Resp: 16  Weight: 185 lb 6.4 oz (84.097 kg)  SpO2: 99%      Assessment & Plan:

## 2015-08-15 NOTE — Patient Instructions (Addendum)
We have sent in the refill of the medicine for the blood pressure. Instead of doing the labs today we would like you to come back in about 4 weeks to do the blood work in the morning so that we don't have to recheck with the dose change on the blood pressure medicine.   We would like you to come back in about 6 months just to check on the blood pressure.   Health Maintenance, Female Adopting a healthy lifestyle and getting preventive care can go a long way to promote health and wellness. Talk with your health care provider about what schedule of regular examinations is right for you. This is a good chance for you to check in with your provider about disease prevention and staying healthy. In between checkups, there are plenty of things you can do on your own. Experts have done a lot of research about which lifestyle changes and preventive measures are most likely to keep you healthy. Ask your health care provider for more information. WEIGHT AND DIET  Eat a healthy diet  Be sure to include plenty of vegetables, fruits, low-fat dairy products, and lean protein.  Do not eat a lot of foods high in solid fats, added sugars, or salt.  Get regular exercise. This is one of the most important things you can do for your health.  Most adults should exercise for at least 150 minutes each week. The exercise should increase your heart rate and make you sweat (moderate-intensity exercise).  Most adults should also do strengthening exercises at least twice a week. This is in addition to the moderate-intensity exercise.  Maintain a healthy weight  Body mass index (BMI) is a measurement that can be used to identify possible weight problems. It estimates body fat based on height and weight. Your health care provider can help determine your BMI and help you achieve or maintain a healthy weight.  For females 26 years of age and older:   A BMI below 18.5 is considered underweight.  A BMI of 18.5 to 24.9 is  normal.  A BMI of 25 to 29.9 is considered overweight.  A BMI of 30 and above is considered obese.  Watch levels of cholesterol and blood lipids  You should start having your blood tested for lipids and cholesterol at 69 years of age, then have this test every 5 years.  You may need to have your cholesterol levels checked more often if:  Your lipid or cholesterol levels are high.  You are older than 69 years of age.  You are at high risk for heart disease.  CANCER SCREENING   Lung Cancer  Lung cancer screening is recommended for adults 75-63 years old who are at high risk for lung cancer because of a history of smoking.  A yearly low-dose CT scan of the lungs is recommended for people who:  Currently smoke.  Have quit within the past 15 years.  Have at least a 30-pack-year history of smoking. A pack year is smoking an average of one pack of cigarettes a day for 1 year.  Yearly screening should continue until it has been 15 years since you quit.  Yearly screening should stop if you develop a health problem that would prevent you from having lung cancer treatment.  Breast Cancer  Practice breast self-awareness. This means understanding how your breasts normally appear and feel.  It also means doing regular breast self-exams. Let your health care provider know about any changes, no matter how  small.  If you are in your 20s or 30s, you should have a clinical breast exam (CBE) by a health care provider every 1-3 years as part of a regular health exam.  If you are 40 or older, have a CBE every year. Also consider having a breast X-ray (mammogram) every year.  If you have a family history of breast cancer, talk to your health care provider about genetic screening.  If you are at high risk for breast cancer, talk to your health care provider about having an MRI and a mammogram every year.  Breast cancer gene (BRCA) assessment is recommended for women who have family members  with BRCA-related cancers. BRCA-related cancers include:  Breast.  Ovarian.  Tubal.  Peritoneal cancers.  Results of the assessment will determine the need for genetic counseling and BRCA1 and BRCA2 testing. Cervical Cancer Your health care provider may recommend that you be screened regularly for cancer of the pelvic organs (ovaries, uterus, and vagina). This screening involves a pelvic examination, including checking for microscopic changes to the surface of your cervix (Pap test). You may be encouraged to have this screening done every 3 years, beginning at age 54.  For women ages 19-65, health care providers may recommend pelvic exams and Pap testing every 3 years, or they may recommend the Pap and pelvic exam, combined with testing for human papilloma virus (HPV), every 5 years. Some types of HPV increase your risk of cervical cancer. Testing for HPV may also be done on women of any age with unclear Pap test results.  Other health care providers may not recommend any screening for nonpregnant women who are considered low risk for pelvic cancer and who do not have symptoms. Ask your health care provider if a screening pelvic exam is right for you.  If you have had past treatment for cervical cancer or a condition that could lead to cancer, you need Pap tests and screening for cancer for at least 20 years after your treatment. If Pap tests have been discontinued, your risk factors (such as having a new sexual partner) need to be reassessed to determine if screening should resume. Some women have medical problems that increase the chance of getting cervical cancer. In these cases, your health care provider may recommend more frequent screening and Pap tests. Colorectal Cancer  This type of cancer can be detected and often prevented.  Routine colorectal cancer screening usually begins at 69 years of age and continues through 69 years of age.  Your health care provider may recommend  screening at an earlier age if you have risk factors for colon cancer.  Your health care provider may also recommend using home test kits to check for hidden blood in the stool.  A small camera at the end of a tube can be used to examine your colon directly (sigmoidoscopy or colonoscopy). This is done to check for the earliest forms of colorectal cancer.  Routine screening usually begins at age 46.  Direct examination of the colon should be repeated every 5-10 years through 69 years of age. However, you may need to be screened more often if early forms of precancerous polyps or small growths are found. Skin Cancer  Check your skin from head to toe regularly.  Tell your health care provider about any new moles or changes in moles, especially if there is a change in a mole's shape or color.  Also tell your health care provider if you have a mole that is larger  than the size of a pencil eraser.  Always use sunscreen. Apply sunscreen liberally and repeatedly throughout the day.  Protect yourself by wearing long sleeves, pants, a wide-brimmed hat, and sunglasses whenever you are outside. HEART DISEASE, DIABETES, AND HIGH BLOOD PRESSURE   High blood pressure causes heart disease and increases the risk of stroke. High blood pressure is more likely to develop in:  People who have blood pressure in the high end of the normal range (130-139/85-89 mm Hg).  People who are overweight or obese.  People who are African American.  If you are 31-59 years of age, have your blood pressure checked every 3-5 years. If you are 41 years of age or older, have your blood pressure checked every year. You should have your blood pressure measured twice--once when you are at a hospital or clinic, and once when you are not at a hospital or clinic. Record the average of the two measurements. To check your blood pressure when you are not at a hospital or clinic, you can use:  An automated blood pressure machine at a  pharmacy.  A home blood pressure monitor.  If you are between 36 years and 98 years old, ask your health care provider if you should take aspirin to prevent strokes.  Have regular diabetes screenings. This involves taking a blood sample to check your fasting blood sugar level.  If you are at a normal weight and have a low risk for diabetes, have this test once every three years after 69 years of age.  If you are overweight and have a high risk for diabetes, consider being tested at a younger age or more often. PREVENTING INFECTION  Hepatitis B  If you have a higher risk for hepatitis B, you should be screened for this virus. You are considered at high risk for hepatitis B if:  You were born in a country where hepatitis B is common. Ask your health care provider which countries are considered high risk.  Your parents were born in a high-risk country, and you have not been immunized against hepatitis B (hepatitis B vaccine).  You have HIV or AIDS.  You use needles to inject street drugs.  You live with someone who has hepatitis B.  You have had sex with someone who has hepatitis B.  You get hemodialysis treatment.  You take certain medicines for conditions, including cancer, organ transplantation, and autoimmune conditions. Hepatitis C  Blood testing is recommended for:  Everyone born from [birth year] through 1964.  Anyone with known risk factors for hepatitis C. Sexually transmitted infections (STIs)  You should be screened for sexually transmitted infections (STIs) including gonorrhea and chlamydia if:  You are sexually active and are younger than 69 years of age.  You are older than 69 years of age and your health care provider tells you that you are at risk for this type of infection.  Your sexual activity has changed since you were last screened and you are at an increased risk for chlamydia or gonorrhea. Ask your health care provider if you are at risk.  If you do not  have HIV, but are at risk, it may be recommended that you take a prescription medicine daily to prevent HIV infection. This is called pre-exposure prophylaxis (PrEP). You are considered at risk if:  You are sexually active and do not regularly use condoms or know the HIV status of your partner(s).  You take drugs by injection.  You are sexually active with a  partner who has HIV. Talk with your health care provider about whether you are at high risk of being infected with HIV. If you choose to begin PrEP, you should first be tested for HIV. You should then be tested every 3 months for as long as you are taking PrEP.  PREGNANCY   If you are premenopausal and you may become pregnant, ask your health care provider about preconception counseling.  If you may become pregnant, take 400 to 800 micrograms (mcg) of folic acid every day.  If you want to prevent pregnancy, talk to your health care provider about birth control (contraception). OSTEOPOROSIS AND MENOPAUSE   Osteoporosis is a disease in which the bones lose minerals and strength with aging. This can result in serious bone fractures. Your risk for osteoporosis can be identified using a bone density scan.  If you are 60 years of age or older, or if you are at risk for osteoporosis and fractures, ask your health care provider if you should be screened.  Ask your health care provider whether you should take a calcium or vitamin D supplement to lower your risk for osteoporosis.  Menopause may have certain physical symptoms and risks.  Hormone replacement therapy may reduce some of these symptoms and risks. Talk to your health care provider about whether hormone replacement therapy is right for you.  HOME CARE INSTRUCTIONS   Schedule regular health, dental, and eye exams.  Stay current with your immunizations.   Do not use any tobacco products including cigarettes, chewing tobacco, or electronic cigarettes.  If you are pregnant, do not  drink alcohol.  If you are breastfeeding, limit how much and how often you drink alcohol.  Limit alcohol intake to no more than 1 drink per day for nonpregnant women. One drink equals 12 ounces of beer, 5 ounces of wine, or 1 ounces of hard liquor.  Do not use street drugs.  Do not share needles.  Ask your health care provider for help if you need support or information about quitting drugs.  Tell your health care provider if you often feel depressed.  Tell your health care provider if you have ever been abused or do not feel safe at home.   This information is not intended to replace advice given to you by your health care provider. Make sure you discuss any questions you have with your health care provider.   Document Released: 02/02/2011 Document Revised: 08/10/2014 Document Reviewed: 06/21/2013 Elsevier Interactive Patient Education [employer].

## 2015-08-16 NOTE — Assessment & Plan Note (Signed)
Colonoscopy up to date, immunizations except shingles up to date. Mammogram up to date. Checking labs today, counseled about exercise as a health maintenance. Given 10 year screening recommendations.

## 2015-08-16 NOTE — Assessment & Plan Note (Signed)
BP above goal so addressed during visit. Increased her lisinopril/hctz dosing from 10/12.5 to 20/25mg  daily and checking labs in 3-4 weeks.

## 2015-08-21 ENCOUNTER — Ambulatory Visit
Admission: RE | Admit: 2015-08-21 | Discharge: 2015-08-21 | Disposition: A | Discharge status: Home / Self Care | Payer: Commercial Managed Care - HMO | Source: Ambulatory Visit

## 2015-08-21 DIAGNOSIS — Z1231 Encounter for screening mammogram for malignant neoplasm of breast: Secondary | ICD-10-CM | POA: Diagnosis not present

## 2015-09-16 ENCOUNTER — Other Ambulatory Visit (INDEPENDENT_AMBULATORY_CARE_PROVIDER_SITE_OTHER): Payer: Commercial Managed Care - HMO

## 2015-09-16 DIAGNOSIS — Z0000 Encounter for general adult medical examination without abnormal findings: Secondary | ICD-10-CM

## 2015-09-16 LAB — COMPREHENSIVE METABOLIC PANEL
ALT: 11 U/L (ref 0–35)
AST: 18 U/L (ref 0–37)
Albumin: 4 g/dL (ref 3.5–5.2)
Alkaline Phosphatase: 40 U/L (ref 39–117)
BUN: 17 mg/dL (ref 6–23)
CHLORIDE: 101 meq/L (ref 96–112)
CO2: 30 meq/L (ref 19–32)
CREATININE: 0.94 mg/dL (ref 0.40–1.20)
Calcium: 9.6 mg/dL (ref 8.4–10.5)
GFR: 75.98 mL/min (ref >60.00)
Glucose, Bld: 98 mg/dL (ref 70–99)
Potassium: 4.8 mEq/L (ref 3.5–5.1)
SODIUM: 136 meq/L (ref 135–145)
Total Bilirubin: 0.4 mg/dL (ref 0.2–1.2)
Total Protein: 7.6 g/dL (ref 6.0–8.3)

## 2015-09-16 LAB — LIPID PANEL
CHOL/HDL RATIO: 3
Cholesterol: 183 mg/dL (ref 0–200)
HDL: 72.4 mg/dL (ref >39.00)
LDL CALC: 95 mg/dL (ref 0–99)
NonHDL: 110.81
TRIGLYCERIDES: 79 mg/dL (ref 0.0–149.0)
VLDL: 15.8 mg/dL (ref 0.0–40.0)

## 2015-09-16 LAB — HEMOGLOBIN A1C: Hgb A1c MFr Bld: 6 % (ref 4.6–6.5)

## 2015-09-17 LAB — HEPATITIS C ANTIBODY: HCV Ab: NEGATIVE

## 2015-11-11 ENCOUNTER — Encounter: Payer: Self-pay | Admitting: Obstetrics & Gynecology

## 2015-11-11 ENCOUNTER — Ambulatory Visit (INDEPENDENT_AMBULATORY_CARE_PROVIDER_SITE_OTHER): Payer: Commercial Managed Care - HMO | Admitting: Obstetrics & Gynecology

## 2015-11-11 VITALS — BP 139/84 | HR 78 | Wt 186.5 lb

## 2015-11-11 DIAGNOSIS — C541 Malignant neoplasm of endometrium: Secondary | ICD-10-CM | POA: Diagnosis not present

## 2015-11-11 NOTE — Progress Notes (Signed)
Patient ID: [name], female   DOB: July 22, 1946, 70 y.o.   MRN: [id number] History:  70 y.o. G1P1001 here today for pelvic exam as f/u of adenoca of the endometrium.  She denies pain. She denies weight loss.  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   The following portions of the patient's history were reviewed and updated as appropriate: allergies, current medications, past family history, past medical history, past social history, past surgical history and problem list.  Review of Systems:  Pertinent items are noted in HPI.  Objective:  Physical Exam Blood pressure 139/84, pulse 78, weight 186 lb 8 oz (84.596 kg). Gen: NAD Abd: Soft, nontender and nondistended Pelvic: Normal appearing external genitalia;  Uterus and adnexa surgically absent.  No palpable masses. Or tenderness  Assessment & Plan:   Patient Active Problem List   Diagnosis Date Noted  . Bunion 03/28/2014  . Endometrial adenocarcinoma ([place]) 07/14/2011  . Routine health maintenance 07/14/2011  . Essential hypertension 07/04/2007    F/u in 6 months for an annual.  After next visit pt will be 5 years out and can f/u annually as long as there are no issues.  Timya Trimmer L. Harraway-Smith, M.D., [name]

## 2015-11-11 NOTE — Patient Instructions (Signed)
Uterine Cancer Uterine cancer is an abnormal growth of tissue (tumor) in the uterus that is cancerous (malignant). Unlike noncancerous (benign) tumors, malignant tumors can spread to other parts of your body. The wall of the uterus has two layers of tissue. The inner layer is the endometrium. The outer layer of muscle tissue is the myometrium. The most common type of uterine cancer begins in the endometrium. This is called endometrial cancer. Cancer that begins in the myometrium is called uterine sarcoma, which is very rare.  RISK FACTORS  Although the exact cause of uterine cancer is unknown, there are a number of risk factors that can increase your chances of getting uterine cancer. They include:  Your age. Uterine cancer occurs mostly in women older than 50 years.   Having an enlarged endometrium (endometrial hyperplasia).   Using hormone therapy.   Obesity.   Taking the drug tamoxifen.   White race.   Infertility.   Never being pregnant.   Beginning menstrual periods at an age younger than 12 years.   Having menstrual periods at an age older than 5 years.   Personal history of ovarian, intestinal, or colorectal cancer.   Having a family history of uterine cancer.   Having a family history of hereditary nonpolyposis colon cancer (HNPCC).   Having diabetes, high blood pressure, thyroid disease, or gallbladder disease.   Long-term use of high-dose birth control pills.   Exposure to radiation.   Smoking.  SIGNS AND SYMPTOMS   Abnormal vaginal bleeding or discharge. Bleeding may start as a watery, blood-streaked flow that gradually contains more blood.   Any vaginal bleeding after menopause.   Difficult or painful urination.   Pain during intercourse.   Pain in the pelvic area.  Mass in the vagina.  Pain or fullness in the abdomen.  Frequent urination.  Bleeding between periods.  Growth of the stomach.   Unexplained weight loss.   Uterine cancer usually occurs after menopause. However, it may also occur around the time that menopause begins. Abnormal vaginal bleeding is the most common symptom of uterine cancer. Women should not assume that abnormal vaginal bleeding is part of menopause. DIAGNOSIS  Your health care provider will ask about your medical history. He or she may also perform a number of procedures, such as:  A physical and pelvic exam. Your health care provider will feel your pelvis for any lumps.   Blood and urine tests.   X-rays.   Imaging tests, such as CT scans, ultrasonography, or MRIs.   A hysteroscopy to view the inside of your uterus.   A Pap test to sample cells from the cervix and upper vagina to check for abnormal cells.   Taking a tissue sample (biopsy) from the uterine lining to look for cancer cells.   A dilation and curettage (D&C). This involves stretching (dilation) the cervix and scraping (curettage) the inside lining of the uterus to get a tissue sample. The sample is examined under a microscope to look for cancer cells.  Your cancer will be staged to determine its severity and extent. Staging is a careful attempt to find out the size of the tumor, whether the cancer has spread, and if so, to what parts of the body. You may need to have more tests to determine the stage of your cancer. The test results will help determine what treatment plan is best for you. Cancer stages include:   Stage I. The cancer is only found in the uterus.  Stage II. The  cancer has spread to the cervix.  Stage III. The cancer has spread outside the uterus, but not outside the pelvis. The cancer may have spread to the lymph nodes in the pelvis.  Stage IV. The cancer has spread to other parts of the body, such as the bladder or rectum. TREATMENT  Most women with uterine cancer are treated with surgery. This includes removing the uterus, cervix, fallopian tubes, and ovaries (total hysterectomy). Your  lymph nodes near the tumor may also be removed. Some women have radiation, chemotherapy, or hormonal therapy. Other women have a combination of these therapies. HOME CARE INSTRUCTIONS   Take medicines only as directed by your health care provider.   Maintain a healthy diet.  Exercise regularly.   If you have diabetes, high blood pressure, thyroid disease, or gallbladder disease, follow your health care provider's instructions to keep it under control.   Do not smoke.   Consider joining a support group. This may help you learn to cope with the stress of having uterine cancer.   Seek advice to help you manage treatment side effects.   Keep all follow-up visits as directed by your health care provider.  SEEK MEDICAL CARE IF:  You have increased stomach or pelvic pain.  You cannot urinate.  You have abnormal bleeding.   This information is not intended to replace advice given to you by your health care provider. Make sure you discuss any questions you have with your health care provider.   Document Released: 07/20/2005 Document Revised: 08/10/2014 Document Reviewed: 01/06/2013 Elsevier Interactive Patient Education 2016 [employer] Inc[employer]  

## 2016-02-12 ENCOUNTER — Ambulatory Visit (INDEPENDENT_AMBULATORY_CARE_PROVIDER_SITE_OTHER): Payer: Commercial Managed Care - HMO | Admitting: Internal Medicine

## 2016-02-12 ENCOUNTER — Encounter: Payer: Self-pay | Admitting: Internal Medicine

## 2016-02-12 VITALS — BP 118/70 | HR 77 | Temp 98.7°F | Resp 18 | Ht 64.0 in | Wt 183.4 lb

## 2016-02-12 DIAGNOSIS — I10 Essential (primary) hypertension: Secondary | ICD-10-CM

## 2016-02-12 NOTE — Progress Notes (Signed)
   Subjective:    Patient ID: [name], female    DOB: 21-Nov-1945, 70 y.o.   MRN: [id number]  HPI The patient is a 70 YO female coming in for follow up of her blood pressure. She had increase in dosing of her lisinopril and hctz since last visit. She has been feeling somewhat lightheaded since that time. No other changes to diet or exercise. She is not exercising now. Sometimes feels more like the room is spinning. Worse in the morning before she eats and gradually gets better throughout the day. Has not had anything to eat yet today except some coffee and water.   Review of Systems  Constitutional: Negative for fever, activity change, appetite change, fatigue and unexpected weight change.  HENT: Negative.   Respiratory: Negative for cough, chest tightness, shortness of breath and wheezing.   Cardiovascular: Negative for chest pain, palpitations and leg swelling.  Gastrointestinal: Negative for nausea, abdominal pain, diarrhea, constipation and abdominal distention.  Musculoskeletal: Negative.   Skin: Negative.   Neurological: Positive for dizziness. Negative for syncope, weakness, light-headedness, numbness and headaches.      Objective:   Physical Exam  Constitutional: She is oriented to person, place, and time. She appears well-developed and well-nourished.  HENT:  Head: Normocephalic and atraumatic.  Eyes: EOM are normal.  Neck: Normal range of motion.  Cardiovascular: Normal rate and regular rhythm.   Pulmonary/Chest: Breath sounds normal. No respiratory distress. She has no wheezes. She has no rales.  Abdominal: Soft. Bowel sounds are normal. She exhibits no distension. There is no tenderness. There is no rebound.  Musculoskeletal: She exhibits no edema.  Neurological: She is alert and oriented to person, place, and time. Coordination normal.  No symptoms with standing in the office.   Skin: Skin is warm and dry.   Filed Vitals:   02/12/16 0934  BP: 152/70  Pulse: 82    Temp: 98.7 F (37.1 C)  TempSrc: Oral  Resp: 18  Height: 5\' 4"  (1.626 m)  Weight: 183 lb 6.4 oz (83.19 kg)  SpO2: 95%      Assessment & Plan:

## 2016-02-12 NOTE — Patient Instructions (Addendum)
We will have you take 1/2 pill of the blood pressure daily.   In 2 weeks call us and let us know if the dizziness is gone.   If the dizziness is better then we will likely add a medicine called amlodipine for blood pressure which is not a fluid pill and should help the blood pressure without affecting the balance or the dizziness.

## 2016-02-12 NOTE — Progress Notes (Signed)
Pre visit review using our clinic review tool, if applicable. No additional management support is needed unless otherwise documented below in the visit note. 

## 2016-02-12 NOTE — Assessment & Plan Note (Signed)
BP much closer to goal. Orthostatic vital signs close to positive in the office. No diastolic change but about 20 point drop in systolic (need to be [id number] to be positive). Will cut dose to lisinopril/hctz 10/12.5 and wait 2 weeks. If symptoms resolved will start amlodipine 10 mg daily.

## 2016-05-18 ENCOUNTER — Ambulatory Visit (INDEPENDENT_AMBULATORY_CARE_PROVIDER_SITE_OTHER): Payer: Commercial Managed Care - HMO | Admitting: Obstetrics & Gynecology

## 2016-05-18 ENCOUNTER — Ambulatory Visit: Payer: Commercial Managed Care - HMO | Admitting: Obstetrics & Gynecology

## 2016-05-18 ENCOUNTER — Encounter: Payer: Self-pay | Admitting: Obstetrics & Gynecology

## 2016-05-18 VITALS — BP 163/73 | HR 68

## 2016-05-18 DIAGNOSIS — Z01419 Encounter for gynecological examination (general) (routine) without abnormal findings: Secondary | ICD-10-CM

## 2016-05-18 NOTE — Patient Instructions (Signed)
Uterine Cancer Uterine cancer is an abnormal growth of tissue (tumor) in the uterus that is cancerous (malignant). Unlike noncancerous (benign) tumors, malignant tumors can spread to other parts of your body. The wall of the uterus has two layers of tissue. The inner layer is the endometrium. The outer layer of muscle tissue is the myometrium. The most common type of uterine cancer begins in the endometrium. This is called endometrial cancer. Cancer that begins in the myometrium is called uterine sarcoma, which is very rare.  RISK FACTORS  Although the exact cause of uterine cancer is unknown, there are a number of risk factors that can increase your chances of getting uterine cancer. They include:  Your age. Uterine cancer occurs mostly in women older than 50 years.   Having an enlarged endometrium (endometrial hyperplasia).   Using hormone therapy.   Obesity.   Taking the drug tamoxifen.   White race.   Infertility.   Never being pregnant.   Beginning menstrual periods at an age younger than 12 years.   Having menstrual periods at an age older than 5 years.   Personal history of ovarian, intestinal, or colorectal cancer.   Having a family history of uterine cancer.   Having a family history of hereditary nonpolyposis colon cancer (HNPCC).   Having diabetes, high blood pressure, thyroid disease, or gallbladder disease.   Long-term use of high-dose birth control pills.   Exposure to radiation.   Smoking.  SIGNS AND SYMPTOMS   Abnormal vaginal bleeding or discharge. Bleeding may start as a watery, blood-streaked flow that gradually contains more blood.   Any vaginal bleeding after menopause.   Difficult or painful urination.   Pain during intercourse.   Pain in the pelvic area.  Mass in the vagina.  Pain or fullness in the abdomen.  Frequent urination.  Bleeding between periods.  Growth of the stomach.   Unexplained weight loss.   Uterine cancer usually occurs after menopause. However, it may also occur around the time that menopause begins. Abnormal vaginal bleeding is the most common symptom of uterine cancer. Women should not assume that abnormal vaginal bleeding is part of menopause. DIAGNOSIS  Your health care provider will ask about your medical history. He or she may also perform a number of procedures, such as:  A physical and pelvic exam. Your health care provider will feel your pelvis for any lumps.   Blood and urine tests.   X-rays.   Imaging tests, such as CT scans, ultrasonography, or MRIs.   A hysteroscopy to view the inside of your uterus.   A Pap test to sample cells from the cervix and upper vagina to check for abnormal cells.   Taking a tissue sample (biopsy) from the uterine lining to look for cancer cells.   A dilation and curettage (D&C). This involves stretching (dilation) the cervix and scraping (curettage) the inside lining of the uterus to get a tissue sample. The sample is examined under a microscope to look for cancer cells.  Your cancer will be staged to determine its severity and extent. Staging is a careful attempt to find out the size of the tumor, whether the cancer has spread, and if so, to what parts of the body. You may need to have more tests to determine the stage of your cancer. The test results will help determine what treatment plan is best for you. Cancer stages include:   Stage I. The cancer is only found in the uterus.  Stage II. The  cancer has spread to the cervix.  Stage III. The cancer has spread outside the uterus, but not outside the pelvis. The cancer may have spread to the lymph nodes in the pelvis.  Stage IV. The cancer has spread to other parts of the body, such as the bladder or rectum. TREATMENT  Most women with uterine cancer are treated with surgery. This includes removing the uterus, cervix, fallopian tubes, and ovaries (total hysterectomy). Your  lymph nodes near the tumor may also be removed. Some women have radiation, chemotherapy, or hormonal therapy. Other women have a combination of these therapies. HOME CARE INSTRUCTIONS   Take medicines only as directed by your health care provider.   Maintain a healthy diet.  Exercise regularly.   If you have diabetes, high blood pressure, thyroid disease, or gallbladder disease, follow your health care provider's instructions to keep it under control.   Do not smoke.   Consider joining a support group. This may help you learn to cope with the stress of having uterine cancer.   Seek advice to help you manage treatment side effects.   Keep all follow-up visits as directed by your health care provider.  SEEK MEDICAL CARE IF:  You have increased stomach or pelvic pain.  You cannot urinate.  You have abnormal bleeding.   This information is not intended to replace advice given to you by your health care provider. Make sure you discuss any questions you have with your health care provider.   Document Released: 07/20/2005 Document Revised: 08/10/2014 Document Reviewed: 01/06/2013 Elsevier Interactive Patient Education [employer].

## 2016-05-18 NOTE — Progress Notes (Signed)
Subjective:     [name] is a 70 y.o. female here for a routine exam.  Current complaints: none. No weight loss. No bloating.   No abd/pelvic pain.   Gynecologic History No LMP recorded. Patient has had a hysterectomy. Contraception: status post hysterectomy Last Pap: 2009. Results were: normal Last mammogram: 08/2015. Results were: normal  Obstetric History OB History  Gravida Para Term Preterm AB Living  1 1 1    0 1  SAB TAB Ectopic Multiple Live Births  0       1    # Outcome Date GA Lbr Len/2nd Weight Sex Delivery Anes PTL Lv  1 Term 08/12/65    M Vag-Spont   LIV       The following portions of the patient's history were reviewed and updated as appropriate: allergies, current medications, past family history, past medical history, past social history, past surgical history and problem list.  Review of Systems Pertinent items are noted in HPI.    Objective:   BP (!) 163/73   Pulse 68   General Appearance:    Alert, cooperative, no distress, appears stated age  Head:    Normocephalic, without obvious abnormality, atraumatic  Eyes:    conjunctiva/corneas clear, EOM's intact, both eyes  Ears:    Normal external ear canals, both ears  Nose:   Nares normal, septum midline, mucosa normal, no drainage    or sinus tenderness  Throat:   Lips, mucosa, and tongue normal; teeth and gums normal  Neck:   Supple, symmetrical, trachea midline, no adenopathy;    thyroid:  no enlargement/tenderness/nodules  Back:     Symmetric, no curvature, ROM normal, no CVA tenderness  Lungs:     Clear to auscultation bilaterally, respirations unlabored  Chest Wall:    No tenderness or deformity   Heart:    Regular rate and rhythm, S1 and S2 normal, no murmur, rub   or gallop  Breast Exam:    No tenderness, masses, or nipple abnormality  Abdomen:     Soft, non-tender, bowel sounds active all four quadrants,    no masses, no organomegaly  Genitalia:    Normal female without lesion, discharge or  tenderness     Extremities:   Extremities normal, atraumatic, no cyanosis or edema  Pulses:   2+ and symmetric all extremities  Skin:   Skin color, texture, turgor normal, no rashes or lesions      Assessment:    Healthy female exam.   H/o endometrial adeno ca 2012.  Reviewed new screening guidelines. Pt is now 5 years out!      Plan:    Follow up in: 1 year.    Screening mammogram in Jan 2017  [place]. Harraway-Smith, M.D., [name]

## 2016-05-25 ENCOUNTER — Ambulatory Visit: Payer: Commercial Managed Care - HMO | Admitting: Obstetrics & Gynecology

## 2016-07-22 ENCOUNTER — Other Ambulatory Visit: Payer: Self-pay | Admitting: Internal Medicine

## 2016-07-22 DIAGNOSIS — Z1231 Encounter for screening mammogram for malignant neoplasm of breast: Secondary | ICD-10-CM

## 2016-08-12 ENCOUNTER — Ambulatory Visit: Payer: Commercial Managed Care - HMO | Admitting: Internal Medicine

## 2016-08-12 ENCOUNTER — Encounter: Payer: Self-pay | Admitting: Internal Medicine

## 2016-08-12 DIAGNOSIS — Z1211 Encounter for screening for malignant neoplasm of colon: Secondary | ICD-10-CM | POA: Diagnosis not present

## 2016-08-12 DIAGNOSIS — K5730 Diverticulosis of large intestine without perforation or abscess without bleeding: Secondary | ICD-10-CM | POA: Diagnosis not present

## 2016-08-12 DIAGNOSIS — K640 First degree hemorrhoids: Secondary | ICD-10-CM | POA: Diagnosis not present

## 2016-08-21 ENCOUNTER — Other Ambulatory Visit: Payer: Self-pay | Admitting: Internal Medicine

## 2016-08-24 ENCOUNTER — Ambulatory Visit
Admission: RE | Admit: 2016-08-24 | Discharge: 2016-08-24 | Disposition: A | Discharge status: Home / Self Care | Payer: Commercial Managed Care - HMO | Source: Ambulatory Visit | Attending: Internal Medicine | Admitting: Internal Medicine

## 2016-08-24 DIAGNOSIS — Z1231 Encounter for screening mammogram for malignant neoplasm of breast: Secondary | ICD-10-CM | POA: Diagnosis not present

## 2016-08-26 ENCOUNTER — Encounter: Payer: Self-pay | Admitting: Internal Medicine

## 2016-08-26 ENCOUNTER — Other Ambulatory Visit (INDEPENDENT_AMBULATORY_CARE_PROVIDER_SITE_OTHER): Payer: Medicare HMO

## 2016-08-26 ENCOUNTER — Ambulatory Visit (INDEPENDENT_AMBULATORY_CARE_PROVIDER_SITE_OTHER): Payer: Medicare HMO | Admitting: Internal Medicine

## 2016-08-26 VITALS — BP 150/84 | HR 63 | Temp 98.0°F | Resp 12 | Ht 64.0 in | Wt 182.0 lb

## 2016-08-26 DIAGNOSIS — Z0000 Encounter for general adult medical examination without abnormal findings: Secondary | ICD-10-CM

## 2016-08-26 DIAGNOSIS — I10 Essential (primary) hypertension: Secondary | ICD-10-CM

## 2016-08-26 LAB — LIPID PANEL
CHOL/HDL RATIO: 2
Cholesterol: 215 mg/dL — ABNORMAL HIGH (ref 0–200)
HDL: 91.2 mg/dL (ref >39.00)
LDL CALC: 108 mg/dL — AB (ref 0–99)
NONHDL: 123.36
TRIGLYCERIDES: 75 mg/dL (ref 0.0–149.0)
VLDL: 15 mg/dL (ref 0.0–40.0)

## 2016-08-26 LAB — CBC
HCT: 40.8 % (ref 36.0–46.0)
HEMOGLOBIN: 13.7 g/dL (ref 12.0–15.0)
MCHC: 33.7 g/dL (ref 30.0–36.0)
MCV: 90.4 fl (ref 78.0–100.0)
Platelets: 182 10*3/uL (ref 150.0–400.0)
RBC: 4.51 Mil/uL (ref 3.87–5.11)
RDW: 14.3 % (ref 11.5–15.5)
WBC: 4.5 10*3/uL (ref 4.0–10.5)

## 2016-08-26 LAB — COMPREHENSIVE METABOLIC PANEL
ALT: 10 U/L (ref 0–35)
AST: 18 U/L (ref 0–37)
Albumin: 4.3 g/dL (ref 3.5–5.2)
Alkaline Phosphatase: 42 U/L (ref 39–117)
BILIRUBIN TOTAL: 0.4 mg/dL (ref 0.2–1.2)
BUN: 15 mg/dL (ref 6–23)
CALCIUM: 9.7 mg/dL (ref 8.4–10.5)
CHLORIDE: 103 meq/L (ref 96–112)
CO2: 29 meq/L (ref 19–32)
Creatinine, Ser: 0.86 mg/dL (ref 0.40–1.20)
GFR: 83.96 mL/min (ref >60.00)
GLUCOSE: 95 mg/dL (ref 70–99)
POTASSIUM: 3.8 meq/L (ref 3.5–5.1)
Sodium: 138 mEq/L (ref 135–145)
Total Protein: 8.1 g/dL (ref 6.0–8.3)

## 2016-08-26 LAB — HEMOGLOBIN A1C: HEMOGLOBIN A1C: 5.9 % (ref 4.6–6.5)

## 2016-08-26 NOTE — Patient Instructions (Signed)
We are checking the labs today and will send the results on mychart.   Keep up the good work.

## 2016-08-26 NOTE — Progress Notes (Signed)
Pre visit review using our clinic review tool, if applicable. No additional management support is needed unless otherwise documented below in the visit note. 

## 2016-08-26 NOTE — Progress Notes (Signed)
   Subjective:    Patient ID: [name], female    DOB: [birth year], 71 y.o.   MRN: [id number]  HPI The patient is a 71 YO female coming in for follow up of her blood pressure. She has been taking her lisinopril/hctz and has felt off for several days. She is not having headaches or SOB or chest pains. Just having slightly less energy. She is wondering if something is wrong with her levels. She has had low potassium in the past and felt similar. She denies change in diet or exercise recently.   Review of Systems  Constitutional: Positive for fatigue. Negative for activity change, appetite change, chills, fever and unexpected weight change.  Respiratory: Negative.   Cardiovascular: Negative.   Gastrointestinal: Negative.   Musculoskeletal: Positive for myalgias.  Skin: Negative.   Neurological: Negative.       Objective:   Physical Exam  Constitutional: She is oriented to person, place, and time. She appears well-developed and well-nourished.  HENT:  Head: Normocephalic and atraumatic.  Eyes: EOM are normal.  Neck: Normal range of motion. No thyromegaly present.  Cardiovascular: Normal rate and regular rhythm.   Pulmonary/Chest: Effort normal and breath sounds normal. No respiratory distress. She has no wheezes. She has no rales.  Abdominal: Soft. She exhibits no distension. There is no tenderness. There is no rebound.  Musculoskeletal: She exhibits no edema.  Neurological: She is alert and oriented to person, place, and time.  Skin: Skin is warm and dry.   Vitals:   08/26/16 1035  BP: (!) 150/84  Pulse: 63  Resp: 12  Temp: 98 F (36.7 C)  TempSrc: Oral  SpO2: 99%  Weight: 182 lb (82.6 kg)  Height: 5\' 4"  (1.626 m)       Assessment & Plan:

## 2016-08-29 NOTE — Assessment & Plan Note (Signed)
Checking CMP for complications. Continue lisinopril/hctz unless labs dictate change. BP mildly high here and she wishes to monitor at home and if still high she will consider change.

## 2016-10-23 DIAGNOSIS — H40033 Anatomical narrow angle, bilateral: Secondary | ICD-10-CM | POA: Diagnosis not present

## 2016-10-23 DIAGNOSIS — H04123 Dry eye syndrome of bilateral lacrimal glands: Secondary | ICD-10-CM | POA: Diagnosis not present

## 2016-11-26 DIAGNOSIS — H04123 Dry eye syndrome of bilateral lacrimal glands: Secondary | ICD-10-CM | POA: Diagnosis not present

## 2017-03-04 ENCOUNTER — Other Ambulatory Visit: Payer: Self-pay | Admitting: Internal Medicine

## 2017-06-16 ENCOUNTER — Encounter: Payer: Self-pay | Admitting: Obstetrics & Gynecology

## 2017-06-16 ENCOUNTER — Ambulatory Visit: Payer: Medicare HMO | Admitting: Obstetrics & Gynecology

## 2017-06-16 VITALS — Wt 182.9 lb

## 2017-06-16 DIAGNOSIS — N949 Unspecified condition associated with female genital organs and menstrual cycle: Secondary | ICD-10-CM

## 2017-06-16 DIAGNOSIS — Z01419 Encounter for gynecological examination (general) (routine) without abnormal findings: Secondary | ICD-10-CM | POA: Diagnosis not present

## 2017-06-16 NOTE — Addendum Note (Signed)
Addended by: Shelly Coss on: 06/16/2017 11:36 AM   Modules accepted: Orders
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
## 2017-06-19 LAB — HERPES SIMPLEX VIRUS CULTURE

## 2017-07-16 ENCOUNTER — Telehealth: Payer: Self-pay | Admitting: Internal Medicine

## 2017-07-16 NOTE — Telephone Encounter (Signed)
Notified pt w/MD response.../lmb 

## 2017-07-16 NOTE — Telephone Encounter (Signed)
Unless she has gotten direct communication from her pharmacy then her medication is not among those recalled.

## 2017-07-16 NOTE — Telephone Encounter (Signed)
Copied from [place]. Topic: Quick Communication - See Telephone Encounter >> Jul 16, 2017 12:00 PM Aurelio Brash B wrote: CRM for notification. See Telephone encounter for:  Pt states there is a recall on her blood pressure medication and she wants to stop taking it   lisinopril-hydrochlorothiazide (PRINZIDE,ZESTORETIC) 20-25 MG tablet 07/16/17.

## 2017-07-28 ENCOUNTER — Other Ambulatory Visit: Payer: Self-pay | Admitting: Internal Medicine

## 2017-07-28 DIAGNOSIS — Z1231 Encounter for screening mammogram for malignant neoplasm of breast: Secondary | ICD-10-CM

## 2017-08-27 ENCOUNTER — Ambulatory Visit
Admission: RE | Admit: 2017-08-27 | Discharge: 2017-08-27 | Disposition: A | Discharge status: Home / Self Care | Payer: Medicare HMO | Source: Ambulatory Visit | Attending: Internal Medicine | Admitting: Internal Medicine

## 2017-08-27 DIAGNOSIS — Z1231 Encounter for screening mammogram for malignant neoplasm of breast: Secondary | ICD-10-CM | POA: Diagnosis not present

## 2017-08-30 ENCOUNTER — Other Ambulatory Visit: Payer: Self-pay

## 2017-08-30 ENCOUNTER — Ambulatory Visit (INDEPENDENT_AMBULATORY_CARE_PROVIDER_SITE_OTHER): Payer: Medicare HMO | Admitting: Internal Medicine

## 2017-08-30 ENCOUNTER — Other Ambulatory Visit (INDEPENDENT_AMBULATORY_CARE_PROVIDER_SITE_OTHER): Payer: Medicare HMO

## 2017-08-30 ENCOUNTER — Encounter: Payer: Self-pay | Admitting: Internal Medicine

## 2017-08-30 VITALS — BP 134/82 | HR 64 | Temp 97.8°F | Ht 64.0 in | Wt 179.0 lb

## 2017-08-30 DIAGNOSIS — Z0000 Encounter for general adult medical examination without abnormal findings: Secondary | ICD-10-CM | POA: Diagnosis not present

## 2017-08-30 DIAGNOSIS — E2839 Other primary ovarian failure: Secondary | ICD-10-CM

## 2017-08-30 DIAGNOSIS — I10 Essential (primary) hypertension: Secondary | ICD-10-CM | POA: Diagnosis not present

## 2017-08-30 DIAGNOSIS — Z8542 Personal history of malignant neoplasm of other parts of uterus: Secondary | ICD-10-CM | POA: Diagnosis not present

## 2017-08-30 LAB — COMPREHENSIVE METABOLIC PANEL
ALBUMIN: 4 g/dL (ref 3.5–5.2)
ALT: 10 U/L (ref 0–35)
AST: 17 U/L (ref 0–37)
Alkaline Phosphatase: 40 U/L (ref 39–117)
BILIRUBIN TOTAL: 0.6 mg/dL (ref 0.2–1.2)
BUN: 17 mg/dL (ref 6–23)
CALCIUM: 9.2 mg/dL (ref 8.4–10.5)
CO2: 28 mEq/L (ref 19–32)
Chloride: 103 mEq/L (ref 96–112)
Creatinine, Ser: 0.88 mg/dL (ref 0.40–1.20)
GFR: 81.53 mL/min (ref >60.00)
Glucose, Bld: 105 mg/dL — ABNORMAL HIGH (ref 70–99)
Potassium: 4.5 mEq/L (ref 3.5–5.1)
SODIUM: 139 meq/L (ref 135–145)
TOTAL PROTEIN: 7.6 g/dL (ref 6.0–8.3)

## 2017-08-30 LAB — CBC
HEMATOCRIT: 40.7 % (ref 36.0–46.0)
Hemoglobin: 13.7 g/dL (ref 12.0–15.0)
MCHC: 33.6 g/dL (ref 30.0–36.0)
MCV: 91.9 fl (ref 78.0–100.0)
Platelets: 178 10*3/uL (ref 150.0–400.0)
RBC: 4.43 Mil/uL (ref 3.87–5.11)
RDW: 13.7 % (ref 11.5–15.5)
WBC: 4.4 10*3/uL (ref 4.0–10.5)

## 2017-08-30 LAB — LIPID PANEL
CHOLESTEROL: 201 mg/dL — AB (ref 0–200)
HDL: 75.8 mg/dL (ref >39.00)
LDL Cholesterol: 111 mg/dL — ABNORMAL HIGH (ref 0–99)
NonHDL: 125.12
TRIGLYCERIDES: 73 mg/dL (ref 0.0–149.0)
Total CHOL/HDL Ratio: 3
VLDL: 14.6 mg/dL (ref 0.0–40.0)

## 2017-08-30 LAB — VITAMIN D 25 HYDROXY (VIT D DEFICIENCY, FRACTURES): VITD: 43.46 ng/mL (ref 30.00–100.00)

## 2017-08-30 LAB — HEMOGLOBIN A1C: HEMOGLOBIN A1C: 6 % (ref 4.6–6.5)

## 2017-08-30 MED ORDER — ZOSTER VAC RECOMB ADJUVANTED 50 MCG/0.5ML IM SUSR: 0.5000 mL | Freq: Once | INTRAMUSCULAR | 1 refills | Status: AC

## 2017-08-30 MED ORDER — LISINOPRIL-HYDROCHLOROTHIAZIDE 20-25 MG PO TABS: 1.0000 | ORAL_TABLET | Freq: Every day | ORAL | 1 refills | Status: DC

## 2017-08-30 NOTE — Assessment & Plan Note (Signed)
BP at goal on lisinopril/hctz. Checking CMP and adjust as needed.  

## 2017-08-30 NOTE — Assessment & Plan Note (Signed)
S/P resection in 2012 and annual follow up with gyn fine.

## 2017-08-30 NOTE — Assessment & Plan Note (Signed)
Given rx for shingles vaccine. Dexa ordered today. Mammogram results normal and shared with patient. Flu and tetanus and pneumonia vaccines up to date. Colonoscopy up to date and due in 2028. Would be a good candidate for cologuard at that time. Counseled about sun safety and mole surveillance. Given 10 year screening recommendations.

## 2017-08-30 NOTE — Patient Instructions (Signed)
We have given you the prescription for the shingles shot. You can get this done at the pharmacy.   We will get the labs and the bone density test.   Keep up the great work with the food.   Health Maintenance, Female Adopting a healthy lifestyle and getting preventive care can go a long way to promote health and wellness. Talk with your health care provider about what schedule of regular examinations is right for you. This is a good chance for you to check in with your provider about disease prevention and staying healthy. In between checkups, there are plenty of things you can do on your own. Experts have done a lot of research about which lifestyle changes and preventive measures are most likely to keep you healthy. Ask your health care provider for more information. Weight and diet Eat a healthy diet  Be sure to include plenty of vegetables, fruits, low-fat dairy products, and lean protein.  Do not eat a lot of foods high in solid fats, added sugars, or salt.  Get regular exercise. This is one of the most important things you can do for your health. ? Most adults should exercise for at least 150 minutes each week. The exercise should increase your heart rate and make you sweat (moderate-intensity exercise). ? Most adults should also do strengthening exercises at least twice a week. This is in addition to the moderate-intensity exercise.  Maintain a healthy weight  Body mass index (BMI) is a measurement that can be used to identify possible weight problems. It estimates body fat based on height and weight. Your health care provider can help determine your BMI and help you achieve or maintain a healthy weight.  For females 46 years of age and older: ? A BMI below 18.5 is considered underweight. ? A BMI of 18.5 to 24.9 is normal. ? A BMI of 25 to 29.9 is considered overweight. ? A BMI of 30 and above is considered obese.  Watch levels of cholesterol and blood lipids  You should start  having your blood tested for lipids and cholesterol at 71 years of age, then have this test every 5 years.  You may need to have your cholesterol levels checked more often if: ? Your lipid or cholesterol levels are high. ? You are older than 71 years of age. ? You are at high risk for heart disease.  Cancer screening Lung Cancer  Lung cancer screening is recommended for adults 66-64 years old who are at high risk for lung cancer because of a history of smoking.  A yearly low-dose CT scan of the lungs is recommended for people who: ? Currently smoke. ? Have quit within the past 15 years. ? Have at least a 30-pack-year history of smoking. A pack year is smoking an average of one pack of cigarettes a day for 1 year.  Yearly screening should continue until it has been 15 years since you quit.  Yearly screening should stop if you develop a health problem that would prevent you from having lung cancer treatment.  Breast Cancer  Practice breast self-awareness. This means understanding how your breasts normally appear and feel.  It also means doing regular breast self-exams. Let your health care provider know about any changes, no matter how small.  If you are in your 20s or 30s, you should have a clinical breast exam (CBE) by a health care provider every 1-3 years as part of a regular health exam.  If you are 40  or older, have a CBE every year. Also consider having a breast X-ray (mammogram) every year.  If you have a family history of breast cancer, talk to your health care provider about genetic screening.  If you are at high risk for breast cancer, talk to your health care provider about having an MRI and a mammogram every year.  Breast cancer gene (BRCA) assessment is recommended for women who have family members with BRCA-related cancers. BRCA-related cancers include: ? Breast. ? Ovarian. ? Tubal. ? Peritoneal cancers.  Results of the assessment will determine the need for  genetic counseling and BRCA1 and BRCA2 testing.  Cervical Cancer Your health care provider may recommend that you be screened regularly for cancer of the pelvic organs (ovaries, uterus, and vagina). This screening involves a pelvic examination, including checking for microscopic changes to the surface of your cervix (Pap test). You may be encouraged to have this screening done every 3 years, beginning at age 56.  For women ages 35-65, health care providers may recommend pelvic exams and Pap testing every 3 years, or they may recommend the Pap and pelvic exam, combined with testing for human papilloma virus (HPV), every 5 years. Some types of HPV increase your risk of cervical cancer. Testing for HPV may also be done on women of any age with unclear Pap test results.  Other health care providers may not recommend any screening for nonpregnant women who are considered low risk for pelvic cancer and who do not have symptoms. Ask your health care provider if a screening pelvic exam is right for you.  If you have had past treatment for cervical cancer or a condition that could lead to cancer, you need Pap tests and screening for cancer for at least 20 years after your treatment. If Pap tests have been discontinued, your risk factors (such as having a new sexual partner) need to be reassessed to determine if screening should resume. Some women have medical problems that increase the chance of getting cervical cancer. In these cases, your health care provider may recommend more frequent screening and Pap tests.  Colorectal Cancer  This type of cancer can be detected and often prevented.  Routine colorectal cancer screening usually begins at 71 years of age and continues through 71 years of age.  Your health care provider may recommend screening at an earlier age if you have risk factors for colon cancer.  Your health care provider may also recommend using home test kits to check for hidden blood in the  stool.  A small camera at the end of a tube can be used to examine your colon directly (sigmoidoscopy or colonoscopy). This is done to check for the earliest forms of colorectal cancer.  Routine screening usually begins at age 5.  Direct examination of the colon should be repeated every 5-10 years through 71 years of age. However, you may need to be screened more often if early forms of precancerous polyps or small growths are found.  Skin Cancer  Check your skin from head to toe regularly.  Tell your health care provider about any new moles or changes in moles, especially if there is a change in a mole's shape or color.  Also tell your health care provider if you have a mole that is larger than the size of a pencil eraser.  Always use sunscreen. Apply sunscreen liberally and repeatedly throughout the day.  Protect yourself by wearing long sleeves, pants, a wide-brimmed hat, and sunglasses whenever you are  outside.  Heart disease, diabetes, and high blood pressure  High blood pressure causes heart disease and increases the risk of stroke. High blood pressure is more likely to develop in: ? People who have blood pressure in the high end of the normal range (130-139/85-89 mm Hg). ? People who are overweight or obese. ? People who are African American.  If you are 18-39 years of age, have your blood pressure checked every 3-5 years. If you are 40 years of age or older, have your blood pressure checked every year. You should have your blood pressure measured twice-once when you are at a hospital or clinic, and once when you are not at a hospital or clinic. Record the average of the two measurements. To check your blood pressure when you are not at a hospital or clinic, you can use: ? An automated blood pressure machine at a pharmacy. ? A home blood pressure monitor.  If you are between 55 years and 79 years old, ask your health care provider if you should take aspirin to prevent  strokes.  Have regular diabetes screenings. This involves taking a blood sample to check your fasting blood sugar level. ? If you are at a normal weight and have a low risk for diabetes, have this test once every three years after 71 years of age. ? If you are overweight and have a high risk for diabetes, consider being tested at a younger age or more often. Preventing infection Hepatitis B  If you have a higher risk for hepatitis B, you should be screened for this virus. You are considered at high risk for hepatitis B if: ? You were born in a country where hepatitis B is common. Ask your health care provider which countries are considered high risk. ? Your parents were born in a high-risk country, and you have not been immunized against hepatitis B (hepatitis B vaccine). ? You have HIV or AIDS. ? You use needles to inject street drugs. ? You live with someone who has hepatitis B. ? You have had sex with someone who has hepatitis B. ? You get hemodialysis treatment. ? You take certain medicines for conditions, including cancer, organ transplantation, and autoimmune conditions.  Hepatitis C  Blood testing is recommended for: ? Everyone born from 1945 through 1965. ? Anyone with known risk factors for hepatitis C.  Sexually transmitted infections (STIs)  You should be screened for sexually transmitted infections (STIs) including gonorrhea and chlamydia if: ? You are sexually active and are younger than 71 years of age. ? You are older than 71 years of age and your health care provider tells you that you are at risk for this type of infection. ? Your sexual activity has changed since you were last screened and you are at an increased risk for chlamydia or gonorrhea. Ask your health care provider if you are at risk.  If you do not have HIV, but are at risk, it may be recommended that you take a prescription medicine daily to prevent HIV infection. This is called pre-exposure prophylaxis  (PrEP). You are considered at risk if: ? You are sexually active and do not regularly use condoms or know the HIV status of your partner(s). ? You take drugs by injection. ? You are sexually active with a partner who has HIV.  Talk with your health care provider about whether you are at high risk of being infected with HIV. If you choose to begin PrEP, you should first be tested   for HIV. You should then be tested every 3 months for as long as you are taking PrEP. Pregnancy  If you are premenopausal and you may become pregnant, ask your health care provider about preconception counseling.  If you may become pregnant, take 400 to 800 micrograms (mcg) of folic acid every day.  If you want to prevent pregnancy, talk to your health care provider about birth control (contraception). Osteoporosis and menopause  Osteoporosis is a disease in which the bones lose minerals and strength with aging. This can result in serious bone fractures. Your risk for osteoporosis can be identified using a bone density scan.  If you are 7 years of age or older, or if you are at risk for osteoporosis and fractures, ask your health care provider if you should be screened.  Ask your health care provider whether you should take a calcium or vitamin D supplement to lower your risk for osteoporosis.  Menopause may have certain physical symptoms and risks.  Hormone replacement therapy may reduce some of these symptoms and risks. Talk to your health care provider about whether hormone replacement therapy is right for you. Follow these instructions at home:  Schedule regular health, dental, and eye exams.  Stay current with your immunizations.  Do not use any tobacco products including cigarettes, chewing tobacco, or electronic cigarettes.  If you are pregnant, do not drink alcohol.  If you are breastfeeding, limit how much and how often you drink alcohol.  Limit alcohol intake to no more than 1 drink per day for  nonpregnant women. One drink equals 12 ounces of beer, 5 ounces of wine, or 1 ounces of hard liquor.  Do not use street drugs.  Do not share needles.  Ask your health care provider for help if you need support or information about quitting drugs.  Tell your health care provider if you often feel depressed.  Tell your health care provider if you have ever been abused or do not feel safe at home. This information is not intended to replace advice given to you by your health care provider. Make sure you discuss any questions you have with your health care provider. Document Released: 02/02/2011 Document Revised: 12/26/2015 Document Reviewed: 04/23/2015 Elsevier Interactive Patient Education  [employer].

## 2017-08-30 NOTE — Progress Notes (Signed)
   Subjective:    Patient ID: Roderick Pee, female    DOB: [birth year], 72 y.o.   MRN: [id number]  HPI Here for medicare wellness and physical, no new complaints. Please see A/P for status and treatment of chronic medical problems.   Diet: heart healthy Physical activity: sedentary Depression/mood screen: negative, some stress with recent separation from spouse Hearing: intact to whispered voice Visual acuity: grossly normal, performs annual eye exam  ADLs: capable Fall risk: none Home safety: good Cognitive evaluation: intact to orientation, naming, recall and repetition EOL planning: adv directives discussed  I have personally reviewed and have noted 1. The patient's medical and social history - reviewed today no changes 2. Their use of alcohol, tobacco or illicit drugs 3. Their current medications and supplements 4. The patient's functional ability including ADL's, fall risks, home safety risks and hearing or visual impairment. 5. Diet and physical activities 6. Evidence for depression or mood disorders 7. Care team reviewed and updated (available in snapshot)  Review of Systems  Constitutional: Negative.   HENT: Negative.   Eyes: Negative.   Respiratory: Negative for cough, chest tightness and shortness of breath.   Cardiovascular: Negative for chest pain, palpitations and leg swelling.  Gastrointestinal: Negative for abdominal distention, abdominal pain, constipation, diarrhea, nausea and vomiting.  Musculoskeletal: Negative.   Skin: Negative.   Neurological: Negative.   Psychiatric/Behavioral: Negative.       Objective:   Physical Exam  Constitutional: She is oriented to person, place, and time. She appears well-developed and well-nourished.  HENT:  Head: Normocephalic and atraumatic.  Eyes: EOM are normal.  Neck: Normal range of motion.  Cardiovascular: Normal rate and regular rhythm.  Carotids without bruit  Pulmonary/Chest: Effort normal and breath sounds  normal. No respiratory distress. She has no wheezes. She has no rales.  Abdominal: Soft. Bowel sounds are normal. She exhibits no distension. There is no tenderness. There is no rebound.  Musculoskeletal: She exhibits no edema.  Neurological: She is alert and oriented to person, place, and time. Coordination normal.  Skin: Skin is warm and dry.  Psychiatric: She has a normal mood and affect.   Vitals:   08/30/17 0824  BP: 134/82  Pulse: 64  Temp: 97.8 F (36.6 C)  TempSrc: Oral  SpO2: 100%  Weight: 179 lb (81.2 kg)  Height: 5\' 4"  (1.626 m)      Assessment & Plan:

## 2017-08-31 ENCOUNTER — Telehealth: Payer: Self-pay | Admitting: Internal Medicine

## 2017-08-31 NOTE — Telephone Encounter (Signed)
Copied from [place] [id number]. Topic: Quick Communication - See Telephone Encounter >> Aug 31, 2017 10:22 AM Cleaster Corin, NT wrote: CRM for notification. See Telephone encounter for:   08/31/17. Pt. Son Lennette Bihari [place]) calling to see if mom can stop taking lisinopril-hydrochlorothiazide due to him having side affects. Pt. Was not around to talk to. Son stated that she is at work and she has not had any side affects. But he doesn't want her to take med. Pt. Son would like for someone to give him a call back. Lennette Bihari can be reached at [phone]. Did not see name on DPR.

## 2017-08-31 NOTE — Telephone Encounter (Signed)
No DPR on file.  

## 2017-09-01 NOTE — Telephone Encounter (Signed)
Called patient and talked to her about her medication and the fact that her son wanted her to stop taking the lisinopril. Patient states that she does not want to stop taking that medication and informed her that a refill was sent on the 28th

## 2017-10-20 ENCOUNTER — Encounter: Payer: Self-pay | Admitting: Internal Medicine

## 2017-11-08 ENCOUNTER — Ambulatory Visit (INDEPENDENT_AMBULATORY_CARE_PROVIDER_SITE_OTHER)
Admission: RE | Admit: 2017-11-08 | Discharge: 2017-11-08 | Disposition: A | Discharge status: Home / Self Care | Payer: Medicare HMO | Source: Ambulatory Visit | Attending: Internal Medicine | Admitting: Internal Medicine

## 2017-11-08 DIAGNOSIS — E2839 Other primary ovarian failure: Secondary | ICD-10-CM | POA: Diagnosis not present

## 2018-03-11 ENCOUNTER — Other Ambulatory Visit: Payer: Self-pay | Admitting: Internal Medicine

## 2018-03-14 DIAGNOSIS — H43393 Other vitreous opacities, bilateral: Secondary | ICD-10-CM | POA: Diagnosis not present

## 2018-03-14 DIAGNOSIS — H40033 Anatomical narrow angle, bilateral: Secondary | ICD-10-CM | POA: Diagnosis not present

## 2018-03-15 DIAGNOSIS — H04123 Dry eye syndrome of bilateral lacrimal glands: Secondary | ICD-10-CM | POA: Diagnosis not present

## 2018-07-04 ENCOUNTER — Ambulatory Visit: Payer: Medicare HMO | Admitting: Obstetrics & Gynecology

## 2018-07-04 ENCOUNTER — Encounter: Payer: Self-pay | Admitting: Obstetrics & Gynecology

## 2018-07-04 VITALS — BP 165/85 | HR 65 | Wt 177.0 lb

## 2018-07-04 DIAGNOSIS — Z01419 Encounter for gynecological examination (general) (routine) without abnormal findings: Secondary | ICD-10-CM

## 2018-07-04 NOTE — Progress Notes (Signed)
Subjective:     [name] is a 72 y.o. female here for a routine exam.  Current complaints: no problems    Gynecologic History No LMP recorded. Patient has had a hysterectomy. Contraception: post menopausal status Last Pap: not indicated Last mammogram: 08/27/2017. Results were: normal  Obstetric History OB History  Gravida Para Term Preterm AB Living  1 1 1    0 1  SAB TAB Ectopic Multiple Live Births  0       1    # Outcome Date GA Lbr Len/2nd Weight Sex Delivery Anes PTL Lv  1 Term 08/12/65    M Vag-Spont   LIV   The following portions of the patient's history were reviewed and updated as appropriate: allergies, current medications, past family history, past medical history, past social history, past surgical history and problem list.  Review of Systems Pertinent items are noted in HPI.    Objective:   BP (!) 164/80   Pulse 65   Wt 177 lb (80.3 kg)   BMI 30.38 kg/m  General Appearance:    Alert, cooperative, no distress, appears stated age  Head:    Normocephalic, without obvious abnormality, atraumatic  Eyes:    conjunctiva/corneas clear, EOM's intact, both eyes  Ears:    Normal external ear canals, both ears  Nose:   Nares normal, septum midline, mucosa normal, no drainage    or sinus tenderness  Throat:   Lips, mucosa, and tongue normal; teeth and gums normal  Neck:   Supple, symmetrical, trachea midline, no adenopathy;    thyroid:  no enlargement/tenderness/nodules  Back:     Symmetric, no curvature, ROM normal, no CVA tenderness  Lungs:     Clear to auscultation bilaterally, respirations unlabored  Chest Wall:    No tenderness or deformity   Heart:    Regular rate and rhythm, S1 and S2 normal, no murmur, rub   or gallop  Breast Exam:    No tenderness, masses, or nipple abnormality  Abdomen:     Soft, non-tender, bowel sounds active all four quadrants,    no masses, no organomegaly  Genitalia:    Normal female without lesion, discharge or tenderness      Extremities:   Extremities normal, atraumatic, no cyanosis or edema  Pulses:   2+ and symmetric all extremities  Skin:   Skin color, texture, turgor normal, no rashes or lesions    Assessment:    Healthy female exam.   BP elevated. Repeated today. Pt with chronic HTN. Was not eating well during Thanksgiving.        Plan:    Follow up in: 1 year.    Pt will get a BP cuff at [facility] to check her BP periodically.  Will have BP check at pharmacy in 1 week Mammogram annually  [place]. Harraway-Smith, M.D., [name]

## 2018-07-19 ENCOUNTER — Other Ambulatory Visit: Payer: Self-pay | Admitting: Internal Medicine

## 2018-07-19 DIAGNOSIS — Z1231 Encounter for screening mammogram for malignant neoplasm of breast: Secondary | ICD-10-CM

## 2018-08-29 ENCOUNTER — Encounter: Payer: Medicare HMO | Admitting: Internal Medicine

## 2018-08-29 ENCOUNTER — Ambulatory Visit
Admission: RE | Admit: 2018-08-29 | Discharge: 2018-08-29 | Disposition: A | Discharge status: Home / Self Care | Payer: Medicare HMO | Source: Ambulatory Visit | Attending: Internal Medicine | Admitting: Internal Medicine

## 2018-08-29 DIAGNOSIS — Z1231 Encounter for screening mammogram for malignant neoplasm of breast: Secondary | ICD-10-CM

## 2018-09-12 ENCOUNTER — Other Ambulatory Visit (INDEPENDENT_AMBULATORY_CARE_PROVIDER_SITE_OTHER): Payer: Medicare HMO

## 2018-09-12 ENCOUNTER — Encounter: Payer: Self-pay | Admitting: Internal Medicine

## 2018-09-12 ENCOUNTER — Ambulatory Visit (INDEPENDENT_AMBULATORY_CARE_PROVIDER_SITE_OTHER): Payer: Medicare HMO | Admitting: Internal Medicine

## 2018-09-12 VITALS — BP 138/90 | HR 66 | Temp 98.0°F | Ht 64.0 in | Wt 176.0 lb

## 2018-09-12 DIAGNOSIS — I10 Essential (primary) hypertension: Secondary | ICD-10-CM | POA: Diagnosis not present

## 2018-09-12 DIAGNOSIS — Z0000 Encounter for general adult medical examination without abnormal findings: Secondary | ICD-10-CM

## 2018-09-12 LAB — CBC
HCT: 40.5 % (ref 36.0–46.0)
HEMOGLOBIN: 13.5 g/dL (ref 12.0–15.0)
MCHC: 33.2 g/dL (ref 30.0–36.0)
MCV: 92.5 fl (ref 78.0–100.0)
Platelets: 191 10*3/uL (ref 150.0–400.0)
RBC: 4.38 Mil/uL (ref 3.87–5.11)
RDW: 14 % (ref 11.5–15.5)
WBC: 3.8 10*3/uL — ABNORMAL LOW (ref 4.0–10.5)

## 2018-09-12 LAB — COMPREHENSIVE METABOLIC PANEL
ALT: 10 U/L (ref 0–35)
AST: 19 U/L (ref 0–37)
Albumin: 4 g/dL (ref 3.5–5.2)
Alkaline Phosphatase: 41 U/L (ref 39–117)
BUN: 16 mg/dL (ref 6–23)
CO2: 27 mEq/L (ref 19–32)
Calcium: 9.5 mg/dL (ref 8.4–10.5)
Chloride: 104 mEq/L (ref 96–112)
Creatinine, Ser: 0.9 mg/dL (ref 0.40–1.20)
GFR: 74.52 mL/min (ref >60.00)
Glucose, Bld: 110 mg/dL — ABNORMAL HIGH (ref 70–99)
Potassium: 4.6 mEq/L (ref 3.5–5.1)
Sodium: 139 mEq/L (ref 135–145)
Total Bilirubin: 0.6 mg/dL (ref 0.2–1.2)
Total Protein: 7.4 g/dL (ref 6.0–8.3)

## 2018-09-12 LAB — LIPID PANEL
Cholesterol: 204 mg/dL — ABNORMAL HIGH (ref 0–200)
HDL: 77.9 mg/dL (ref >39.00)
LDL Cholesterol: 115 mg/dL — ABNORMAL HIGH (ref 0–99)
NONHDL: 126.36
Total CHOL/HDL Ratio: 3
Triglycerides: 55 mg/dL (ref 0.0–149.0)
VLDL: 11 mg/dL (ref 0.0–40.0)

## 2018-09-12 MED ORDER — LISINOPRIL-HYDROCHLOROTHIAZIDE 20-25 MG PO TABS: 1.0000 | ORAL_TABLET | Freq: Every day | ORAL | 3 refills | Status: DC

## 2018-09-12 NOTE — Progress Notes (Signed)
   Subjective:   Patient ID: Roderick Pee, female    DOB: 06/11/1946, 73 y.o.   MRN: [id number]  HPI Here for medicare wellness and physical, no new complaints. Please see A/P for status and treatment of chronic medical problems.   Diet: heart healthy Physical activity: sedentary Depression/mood screen: negative Hearing: intact to whispered voice, mild tinnitus left ear Visual acuity: grossly normal, performs annual eye exam  ADLs: capable Fall risk: none Home safety: good Cognitive evaluation: intact to orientation, naming, recall and repetition EOL planning: adv directives discussed    Office Visit from 09/12/2018 in [place]  PHQ-2 Total Score  0        Office Visit from 07/04/2018 in [place] for [facility]  PHQ-9 Total Score  4     I have personally reviewed and have noted 1. The patient's medical and social history - reviewed today no changes 2. Their use of alcohol, tobacco or illicit drugs 3. Their current medications and supplements 4. The patient's functional ability including ADL's, fall risks, home safety risks and hearing or visual impairment. 5. Diet and physical activities 6. Evidence for depression or mood disorders 7. Care team reviewed and updated (available in snapshot)  Review of Systems  Constitutional: Negative.   HENT: Negative.   Eyes: Negative.   Respiratory: Negative for cough, chest tightness and shortness of breath.   Cardiovascular: Negative for chest pain, palpitations and leg swelling.  Gastrointestinal: Negative for abdominal distention, abdominal pain, constipation, diarrhea, nausea and vomiting.  Musculoskeletal: Negative.   Skin: Negative.   Neurological: Negative.   Psychiatric/Behavioral: Negative.     Objective:  Physical Exam Constitutional:      Appearance: She is well-developed.  HENT:     Head: Normocephalic and atraumatic.  Neck:     Musculoskeletal: Normal range of motion.    Cardiovascular:     Rate and Rhythm: Normal rate and regular rhythm.  Pulmonary:     Effort: Pulmonary effort is normal. No respiratory distress.     Breath sounds: Normal breath sounds. No wheezing or rales.  Abdominal:     General: Bowel sounds are normal. There is no distension.     Palpations: Abdomen is soft.     Tenderness: There is no abdominal tenderness. There is no rebound.  Skin:    General: Skin is warm and dry.  Neurological:     Mental Status: She is alert and oriented to person, place, and time.     Coordination: Coordination normal.    Vitals:   09/12/18 0909  BP: 138/90  Pulse: 66  Temp: 98 F (36.7 C)  TempSrc: Oral  SpO2: 97%  Weight: 176 lb (79.8 kg)  Height: 5\' 4"  (1.626 m)    Assessment & Plan:

## 2018-09-12 NOTE — Assessment & Plan Note (Signed)
BP at goal on lisinopril/hctz 20/25 mg. Checking CMP and adjust as needed.

## 2018-09-12 NOTE — Patient Instructions (Addendum)
Melatonin for sleep or benadryl if that does not work.   Health Maintenance, Female Adopting a healthy lifestyle and getting preventive care can go a long way to promote health and wellness. Talk with your health care provider about what schedule of regular examinations is right for you. This is a good chance for you to check in with your provider about disease prevention and staying healthy. In between checkups, there are plenty of things you can do on your own. Experts have done a lot of research about which lifestyle changes and preventive measures are most likely to keep you healthy. Ask your health care provider for more information. Weight and diet Eat a healthy diet  Be sure to include plenty of vegetables, fruits, low-fat dairy products, and lean protein.  Do not eat a lot of foods high in solid fats, added sugars, or salt.  Get regular exercise. This is one of the most important things you can do for your health. ? Most adults should exercise for at least 150 minutes each week. The exercise should increase your heart rate and make you sweat (moderate-intensity exercise). ? Most adults should also do strengthening exercises at least twice a week. This is in addition to the moderate-intensity exercise. Maintain a healthy weight  Body mass index (BMI) is a measurement that can be used to identify possible weight problems. It estimates body fat based on height and weight. Your health care provider can help determine your BMI and help you achieve or maintain a healthy weight.  For females 20 years of age and older: ? A BMI below 18.5 is considered underweight. ? A BMI of 18.5 to 24.9 is normal. ? A BMI of 25 to 29.9 is considered overweight. ? A BMI of 30 and above is considered obese. Watch levels of cholesterol and blood lipids  You should start having your blood tested for lipids and cholesterol at 72 years of age, then have this test every 5 years.  You may need to have your  cholesterol levels checked more often if: ? Your lipid or cholesterol levels are high. ? You are older than 72 years of age. ? You are at high risk for heart disease. Cancer screening Lung Cancer  Lung cancer screening is recommended for adults 72-52 years old who are at high risk for lung cancer because of a history of smoking.  A yearly low-dose CT scan of the lungs is recommended for people who: ? Currently smoke. ? Have quit within the past 15 years. ? Have at least a 30-pack-year history of smoking. A pack year is smoking an average of one pack of cigarettes a day for 1 year.  Yearly screening should continue until it has been 15 years since you quit.  Yearly screening should stop if you develop a health problem that would prevent you from having lung cancer treatment. Breast Cancer  Practice breast self-awareness. This means understanding how your breasts normally appear and feel.  It also means doing regular breast self-exams. Let your health care provider know about any changes, no matter how small.  If you are in your 72s or 30s, you should have a clinical breast exam (CBE) by a health care provider every 1-3 years as part of a regular health exam.  If you are 77 or older, have a CBE every year. Also consider having a breast X-ray (mammogram) every year.  If you have a 72 family history of breast cancer, talk to your health care provider about genetic  screening.  If you are at high risk for breast cancer, talk to your health care provider about having an MRI and a mammogram every year.  Breast cancer gene (BRCA) assessment is recommended for women who have family members with BRCA-related cancers. BRCA-related cancers include: ? Breast. ? Ovarian. ? Tubal. ? Peritoneal cancers.  Results of the assessment will determine the need for genetic counseling and BRCA1 and BRCA2 testing. Cervical Cancer Your health care provider may recommend that you be screened regularly for  cancer of the pelvic organs (ovaries, uterus, and vagina). This screening involves a pelvic examination, including checking for microscopic changes to the surface of your cervix (Pap test). You may be encouraged to have this screening done every 3 years, beginning at age 72.  For women ages 23-65, health care providers may recommend pelvic exams and Pap testing every 3 years, or they may recommend the Pap and pelvic exam, combined with testing for human papilloma virus (HPV), every 5 years. Some types of HPV increase your risk of cervical cancer. Testing for HPV may also be done on women of any age with unclear Pap test results.  Other health care providers may not recommend any screening for nonpregnant women who are considered low risk for pelvic cancer and who do not have symptoms. Ask your health care provider if a screening pelvic exam is right for you.  If you have had past treatment for cervical cancer or a condition that could lead to cancer, you need Pap tests and screening for cancer for at least 20 years after your treatment. If Pap tests have been discontinued, your risk factors (such as having a new sexual partner) need to be reassessed to determine if screening should resume. Some women have medical problems that increase the chance of getting cervical cancer. In these cases, your health care provider may recommend more frequent screening and Pap tests. Colorectal Cancer  This type of cancer can be detected and often prevented.  Routine colorectal cancer screening usually begins at 72 years of age and continues through 72 years of age.  Your health care provider may recommend screening at an earlier age if you have risk factors for colon cancer.  Your health care provider may also recommend using home test kits to check for hidden blood in the stool.  A small camera at the end of a tube can be used to examine your colon directly (sigmoidoscopy or colonoscopy). This is done to check for  the earliest forms of colorectal cancer.  Routine screening usually begins at age 72.  Direct examination of the colon should be repeated every 5-10 years through 72 years of age. However, you may need to be screened more often if early forms of precancerous polyps or small growths are found. Skin Cancer  Check your skin from head to toe regularly.  Tell your health care provider about any new moles or changes in moles, especially if there is a change in a mole's shape or color.  Also tell your health care provider if you have a mole that is larger than the size of a pencil eraser.  Always use sunscreen. Apply sunscreen liberally and repeatedly throughout the day.  Protect yourself by wearing long sleeves, pants, a wide-brimmed hat, and sunglasses whenever you are outside. Heart disease, diabetes, and high blood pressure  High blood pressure causes heart disease and increases the risk of stroke. High blood pressure is more likely to develop in: ? People who have blood pressure in  the high end of the normal range (130-139/85-89 mm Hg). ? People who are overweight or obese. ? People who are African American.  If you are 33-49 years of age, have your blood pressure checked every 3-5 years. If you are 67 years of age or older, have your blood pressure checked every year. You should have your blood pressure measured twice-once when you are at a hospital or clinic, and once when you are not at a hospital or clinic. Record the average of the two measurements. To check your blood pressure when you are not at a hospital or clinic, you can use: ? An automated blood pressure machine at a pharmacy. ? A home blood pressure monitor.  If you are between 65 years and 48 years old, ask your health care provider if you should take aspirin to prevent strokes.  Have regular diabetes screenings. This involves taking a blood sample to check your fasting blood sugar level. ? If you are at a normal weight and  have a low risk for diabetes, have this test once every three years after 72 years of age. ? If you are overweight and have a high risk for diabetes, consider being tested at a younger age or more often. Preventing infection Hepatitis B  If you have a higher risk for hepatitis B, you should be screened for this virus. You are considered at high risk for hepatitis B if: ? You were born in a country where hepatitis B is common. Ask your health care provider which countries are considered high risk. ? Your parents were born in a high-risk country, and you have not been immunized against hepatitis B (hepatitis B vaccine). ? You have HIV or AIDS. ? You use needles to inject street drugs. ? You live with someone who has hepatitis B. ? You have had sex with someone who has hepatitis B. ? You get hemodialysis treatment. ? You take certain medicines for conditions, including cancer, organ transplantation, and autoimmune conditions. Hepatitis C  Blood testing is recommended for: ? Everyone born from [birth year] through 1965. ? Anyone with known risk factors for hepatitis C. Sexually transmitted infections (STIs)  You should be screened for sexually transmitted infections (STIs) including gonorrhea and chlamydia if: ? You are sexually active and are younger than 72 years of age. ? You are older than 72 years of age and your health care provider tells you that you are at risk for this type of infection. ? Your sexual activity has changed since you were last screened and you are at an increased risk for chlamydia or gonorrhea. Ask your health care provider if you are at risk.  If you do not have HIV, but are at risk, it may be recommended that you take a prescription medicine daily to prevent HIV infection. This is called pre-exposure prophylaxis (PrEP). You are considered at risk if: ? You are sexually active and do not regularly use condoms or know the HIV status of your partner(s). ? You take drugs by  injection. ? You are sexually active with a partner who has HIV. Talk with your health care provider about whether you are at high risk of being infected with HIV. If you choose to begin PrEP, you should first be tested for HIV. You should then be tested every 3 months for as long as you are taking PrEP. Pregnancy  If you are premenopausal and you may become pregnant, ask your health care provider about preconception counseling.  If you may  become pregnant, take 400 to 800 micrograms (mcg) of folic acid every day.  If you want to prevent pregnancy, talk to your health care provider about birth control (contraception). Osteoporosis and menopause  Osteoporosis is a disease in which the bones lose minerals and strength with aging. This can result in serious bone fractures. Your risk for osteoporosis can be identified using a bone density scan.  If you are 50 years of age or older, or if you are at risk for osteoporosis and fractures, ask your health care provider if you should be screened.  Ask your health care provider whether you should take a calcium or vitamin D supplement to lower your risk for osteoporosis.  Menopause may have certain physical symptoms and risks.  Hormone replacement therapy may reduce some of these symptoms and risks. Talk to your health care provider about whether hormone replacement therapy is right for you. Follow these instructions at home:  Schedule regular health, dental, and eye exams.  Stay current with your immunizations.  Do not use any tobacco products including cigarettes, chewing tobacco, or electronic cigarettes.  If you are pregnant, do not drink alcohol.  If you are breastfeeding, limit how much and how often you drink alcohol.  Limit alcohol intake to no more than 1 drink per day for nonpregnant women. One drink equals 12 ounces of beer, 5 ounces of wine, or 1 ounces of hard liquor.  Do not use street drugs.  Do not share needles.  Ask  your health care provider for help if you need support or information about quitting drugs.  Tell your health care provider if you often feel depressed.  Tell your health care provider if you have ever been abused or do not feel safe at home. This information is not intended to replace advice given to you by your health care provider. Make sure you discuss any questions you have with your health care provider. Document Released: 02/02/2011 Document Revised: 12/26/2015 Document Reviewed: 04/23/2015 Elsevier Interactive Patient Education  2019 [employer].

## 2018-09-12 NOTE — Assessment & Plan Note (Signed)
Flu shot up to date. Pneumonia complete. Shingrix counseled. Tetanus up to date. Colonoscopy up to date. Mammogram up to date, pap smear up to date and dexa up to date. Counseled about sun safety and mole surveillance. Counseled about the dangers of distracted driving. Given 10 year screening recommendations.

## 2018-09-16 ENCOUNTER — Telehealth: Payer: Self-pay | Admitting: Internal Medicine

## 2018-09-16 NOTE — Telephone Encounter (Signed)
Copied from [place] [id number]. Topic: Quick Communication - See Telephone Encounter >> Sep 16, 2018 10:05 AM Blase Mess A wrote: CRM for notification. See Telephone encounter for: 09/16/18.  Patient is calling to report her Medicare number until they send her her new card. [id number]

## 2018-09-16 NOTE — Telephone Encounter (Signed)
Updated.

## 2019-01-12 ENCOUNTER — Other Ambulatory Visit: Payer: Self-pay

## 2019-01-12 MED ORDER — LISINOPRIL-HYDROCHLOROTHIAZIDE 20-25 MG PO TABS: 1.0000 | ORAL_TABLET | Freq: Every day | ORAL | 2 refills | Status: DC

## 2019-07-19 ENCOUNTER — Ambulatory Visit (INDEPENDENT_AMBULATORY_CARE_PROVIDER_SITE_OTHER): Payer: Medicare HMO | Admitting: Obstetrics and Gynecology

## 2019-07-19 ENCOUNTER — Other Ambulatory Visit: Payer: Self-pay

## 2019-07-19 ENCOUNTER — Encounter: Payer: Self-pay | Admitting: Obstetrics and Gynecology

## 2019-07-19 VITALS — BP 162/91 | HR 67 | Wt 172.7 lb

## 2019-07-19 DIAGNOSIS — Z01419 Encounter for gynecological examination (general) (routine) without abnormal findings: Secondary | ICD-10-CM

## 2019-07-19 NOTE — Progress Notes (Signed)
Subjective:     [name] is a 73 y.o. female P1 postmenopausal with BMI 29 who is here for a comprehensive physical exam. The patient reports no problems. She is not sexually active. She denies urinary incontinence. She denies pelvic pain, vaginal bleeding or abnormal discharge.  Past Medical History:  Diagnosis Date  . Bone spur R foot   2014  . Cataract   . GERD (gastroesophageal reflux disease)    On no meds  . Hypertension   . Post-menopausal bleeding 02/18/2011   Past Surgical History:  Procedure Laterality Date  . bone spur removed from left foot  2006  . COLONOSCOPY  2007  . EYE SURGERY     both eyes  . HYSTEROSCOPY W/D&C  02/18/2011   Procedure: DILATATION AND CURETTAGE (D&C) /HYSTEROSCOPY;  Surgeon: Frederico Hamman, MD;  Location: [facility];  Service: Gynecology;  Laterality: N/A;  . ROBOTIC ASSISTED LAP VAGINAL HYSTERECTOMY  03/31/11   hyst with SBO due to endometrial cancer   Family History  Problem Relation Age of Onset  . Hyperlipidemia Mother   . Hypertension Mother   . Cancer Father 36       lung cancer  . Hypertension Brother   . Breast cancer Neg Hx     Social History   Socioeconomic History  . Marital status: Married    Spouse name: Not on file  . Number of children: Not on file  . Years of education: Not on file  . Highest education level: Not on file  Occupational History  . Not on file  Tobacco Use  . Smoking status: Never Smoker  . Smokeless tobacco: Never Used  Substance and Sexual Activity  . Alcohol use: Yes    Alcohol/week: 2.0 standard drinks    Types: 2 Cans of beer per week  . Drug use: No  . Sexual activity: Yes    Birth control/protection: Post-menopausal  Other Topics Concern  . Not on file  Social History Narrative   HSG- Linna Hoff   Married 1987, 1 year divorced; remarried 2003   1 son 52   Work: Pharmacist, hospital.         Social Determinants of Health   Financial Resource Strain:   . Difficulty  of Paying Living Expenses: Not on file  Food Insecurity:   . Worried About Charity fundraiser in the Last Year: Not on file  . Ran Out of Food in the Last Year: Not on file  Transportation Needs:   . Lack of Transportation (Medical): Not on file  . Lack of Transportation (Non-Medical): Not on file  Physical Activity:   . Days of Exercise per Week: Not on file  . Minutes of Exercise per Session: Not on file  Stress:   . Feeling of Stress : Not on file  Social Connections:   . Frequency of Communication with Friends and Family: Not on file  . Frequency of Social Gatherings with Friends and Family: Not on file  . Attends Religious Services: Not on file  . Active Member of Clubs or Organizations: Not on file  . Attends Archivist Meetings: Not on file  . Marital Status: Not on file  Intimate Partner Violence:   . Fear of Current or Ex-Partner: Not on file  . Emotionally Abused: Not on file  . Physically Abused: Not on file  . Sexually Abused: Not on file   Health Maintenance  Topic Date Due  . INFLUENZA VACCINE  03/04/2019  . MAMMOGRAM  08/29/2020  . TETANUS/TDAP  07/14/2022  . COLONOSCOPY  08/12/2026  . DEXA SCAN  Completed  . Hepatitis C Screening  Completed  . PNA vac Low Risk Adult  Completed       Review of Systems Pertinent items are noted in HPI.   Objective:  GENERAL: Well-developed, well-nourished female in no acute distress.  HEENT: Normocephalic, atraumatic. Sclerae anicteric.  NECK: Supple. Normal thyroid.  LUNGS: Clear to auscultation bilaterally.  HEART: Regular rate and rhythm. BREASTS: Symmetric in size. No palpable masses or lymphadenopathy, skin changes, or nipple drainage. ABDOMEN: Soft, nontender, nondistended. No organomegaly. PELVIC: Normal external female genitalia. Vagina is pink and rugated.  Normal discharge.  EXTREMITIES: No cyanosis, clubbing, or edema, 2+ distal pulses.       Assessment:    Healthy female exam.      Plan:     Normal pelvic exam Normal screening mammogram 08/2018 Patient had a DEXA scan in 2019 Patient reports normal colonoscopy in 2017 RTC prn See After Visit Summary for Counseling Recommendations

## 2019-07-21 ENCOUNTER — Other Ambulatory Visit: Payer: Self-pay | Admitting: Internal Medicine

## 2019-07-21 DIAGNOSIS — Z1231 Encounter for screening mammogram for malignant neoplasm of breast: Secondary | ICD-10-CM

## 2019-08-05 ENCOUNTER — Encounter

## 2019-09-08 ENCOUNTER — Ambulatory Visit: Payer: Medicare HMO

## 2019-09-15 ENCOUNTER — Encounter: Payer: Medicare HMO | Admitting: Internal Medicine

## 2019-09-20 ENCOUNTER — Encounter: Payer: Self-pay | Admitting: Internal Medicine

## 2019-09-20 ENCOUNTER — Other Ambulatory Visit: Payer: Self-pay

## 2019-09-20 ENCOUNTER — Ambulatory Visit (INDEPENDENT_AMBULATORY_CARE_PROVIDER_SITE_OTHER): Payer: Medicare HMO | Admitting: Internal Medicine

## 2019-09-20 VITALS — BP 146/92 | HR 75 | Temp 98.1°F | Ht 64.0 in | Wt 171.0 lb

## 2019-09-20 DIAGNOSIS — Z0000 Encounter for general adult medical examination without abnormal findings: Secondary | ICD-10-CM | POA: Diagnosis not present

## 2019-09-20 DIAGNOSIS — Z8542 Personal history of malignant neoplasm of other parts of uterus: Secondary | ICD-10-CM | POA: Diagnosis not present

## 2019-09-20 DIAGNOSIS — I10 Essential (primary) hypertension: Secondary | ICD-10-CM

## 2019-09-20 LAB — CBC
HCT: 40.7 % (ref 36.0–46.0)
Hemoglobin: 13.5 g/dL (ref 12.0–15.0)
MCHC: 33.2 g/dL (ref 30.0–36.0)
MCV: 93.7 fl (ref 78.0–100.0)
Platelets: 183 10*3/uL (ref 150.0–400.0)
RBC: 4.34 Mil/uL (ref 3.87–5.11)
RDW: 13.9 % (ref 11.5–15.5)
WBC: 4.1 10*3/uL (ref 4.0–10.5)

## 2019-09-20 LAB — LIPID PANEL
Cholesterol: 189 mg/dL (ref 0–200)
HDL: 80.9 mg/dL (ref >39.00)
LDL Cholesterol: 93 mg/dL (ref 0–99)
NonHDL: 108.08
Total CHOL/HDL Ratio: 2
Triglycerides: 74 mg/dL (ref 0.0–149.0)
VLDL: 14.8 mg/dL (ref 0.0–40.0)

## 2019-09-20 LAB — COMPREHENSIVE METABOLIC PANEL
ALT: 10 U/L (ref 0–35)
AST: 18 U/L (ref 0–37)
Albumin: 4.2 g/dL (ref 3.5–5.2)
Alkaline Phosphatase: 42 U/L (ref 39–117)
BUN: 14 mg/dL (ref 6–23)
CO2: 29 mEq/L (ref 19–32)
Calcium: 9.7 mg/dL (ref 8.4–10.5)
Chloride: 102 mEq/L (ref 96–112)
Creatinine, Ser: 0.9 mg/dL (ref 0.40–1.20)
GFR: 74.31 mL/min (ref >60.00)
Glucose, Bld: 96 mg/dL (ref 70–99)
Potassium: 4.7 mEq/L (ref 3.5–5.1)
Sodium: 137 mEq/L (ref 135–145)
Total Bilirubin: 0.5 mg/dL (ref 0.2–1.2)
Total Protein: 7.8 g/dL (ref 6.0–8.3)

## 2019-09-20 MED ORDER — LISINOPRIL-HYDROCHLOROTHIAZIDE 20-25 MG PO TABS: 1.0000 | ORAL_TABLET | Freq: Every day | ORAL | 3 refills | Status: DC

## 2019-09-20 NOTE — Patient Instructions (Signed)
Health Maintenance, Female Adopting a healthy lifestyle and getting preventive care are important in promoting health and wellness. Ask your health care provider about:  The right schedule for you to have regular tests and exams.  Things you can do on your own to prevent diseases and keep yourself healthy. What should I know about diet, weight, and exercise? Eat a healthy diet   Eat a diet that includes plenty of vegetables, fruits, low-fat dairy products, and lean protein.  Do not eat a lot of foods that are high in solid fats, added sugars, or sodium. Maintain a healthy weight Body mass index (BMI) is used to identify weight problems. It estimates body fat based on height and weight. Your health care provider can help determine your BMI and help you achieve or maintain a healthy weight. Get regular exercise Get regular exercise. This is one of the most important things you can do for your health. Most adults should:  Exercise for at least 150 minutes each week. The exercise should increase your heart rate and make you sweat (moderate-intensity exercise).  Do strengthening exercises at least twice a week. This is in addition to the moderate-intensity exercise.  Spend less time sitting. Even light physical activity can be beneficial. Watch cholesterol and blood lipids Have your blood tested for lipids and cholesterol at 73 years of age, then have this test every 5 years. Have your cholesterol levels checked more often if:  Your lipid or cholesterol levels are high.  You are older than 73 years of age.  You are at high risk for heart disease. What should I know about cancer screening? Depending on your health history and family history, you may need to have cancer screening at various ages. This may include screening for:  Breast cancer.  Cervical cancer.  Colorectal cancer.  Skin cancer.  Lung cancer. What should I know about heart disease, diabetes, and high blood  pressure? Blood pressure and heart disease  High blood pressure causes heart disease and increases the risk of stroke. This is more likely to develop in people who have high blood pressure readings, are of African descent, or are overweight.  Have your blood pressure checked: ? Every 3-5 years if you are 18-39 years of age. ? Every year if you are 40 years old or older. Diabetes Have regular diabetes screenings. This checks your fasting blood sugar level. Have the screening done:  Once every three years after age 40 if you are at a normal weight and have a low risk for diabetes.  More often and at a younger age if you are overweight or have a high risk for diabetes. What should I know about preventing infection? Hepatitis B If you have a higher risk for hepatitis B, you should be screened for this virus. Talk with your health care provider to find out if you are at risk for hepatitis B infection. Hepatitis C Testing is recommended for:  Everyone born from 1945 through 1965.  Anyone with known risk factors for hepatitis C. Sexually transmitted infections (STIs)  Get screened for STIs, including gonorrhea and chlamydia, if: ? You are sexually active and are younger than 73 years of age. ? You are older than 73 years of age and your health care provider tells you that you are at risk for this type of infection. ? Your sexual activity has changed since you were last screened, and you are at increased risk for chlamydia or gonorrhea. Ask your health care provider if   you are at risk.  Ask your health care provider about whether you are at high risk for HIV. Your health care provider may recommend a prescription medicine to help prevent HIV infection. If you choose to take medicine to prevent HIV, you should first get tested for HIV. You should then be tested every 3 months for as long as you are taking the medicine. Pregnancy  If you are about to stop having your period (premenopausal) and  you may become pregnant, seek counseling before you get pregnant.  Take 400 to 800 micrograms (mcg) of folic acid every day if you become pregnant.  Ask for birth control (contraception) if you want to prevent pregnancy. Osteoporosis and menopause Osteoporosis is a disease in which the bones lose minerals and strength with aging. This can result in bone fractures. If you are 65 years old or older, or if you are at risk for osteoporosis and fractures, ask your health care provider if you should:  Be screened for bone loss.  Take a calcium or vitamin D supplement to lower your risk of fractures.  Be given hormone replacement therapy (HRT) to treat symptoms of menopause. Follow these instructions at home: Lifestyle  Do not use any products that contain nicotine or tobacco, such as cigarettes, e-cigarettes, and chewing tobacco. If you need help quitting, ask your health care provider.  Do not use street drugs.  Do not share needles.  Ask your health care provider for help if you need support or information about quitting drugs. Alcohol use  Do not drink alcohol if: ? Your health care provider tells you not to drink. ? You are pregnant, may be pregnant, or are planning to become pregnant.  If you drink alcohol: ? Limit how much you use to 0-1 drink a day. ? Limit intake if you are breastfeeding.  Be aware of how much alcohol is in your drink. In the [place] one drink equals one 12 oz bottle of beer (355 mL), one 5 oz glass of wine (148 mL), or one 1 oz glass of hard liquor (44 mL). General instructions  Schedule regular health, dental, and eye exams.  Stay current with your vaccines.  Tell your health care provider if: ? You often feel depressed. ? You have ever been abused or do not feel safe at home. Summary  Adopting a healthy lifestyle and getting preventive care are important in promoting health and wellness.  Follow your health care provider's instructions about healthy  diet, exercising, and getting tested or screened for diseases.  Follow your health care provider's instructions on monitoring your cholesterol and blood pressure. This information is not intended to replace advice given to you by your health care provider. Make sure you discuss any questions you have with your health care provider. Document Revised: 07/13/2018 Document Reviewed: 07/13/2018 Elsevier Patient Education  2020 [employer]  

## 2019-09-20 NOTE — Progress Notes (Signed)
Subjective:   Patient ID: [name], female    DOB: April 20, 1946, 74 y.o.   MRN: [id number]  HPI Here for medicare wellness and physical, no new complaints. Please see A/P for status and treatment of chronic medical problems.   Diet: heart healthy Physical activity: sedentary Depression/mood screen: negative Hearing: intact to whispered voice Visual acuity: grossly normal, overdue for annual eye exam  ADLs: capable Fall risk: none Home safety: good Cognitive evaluation: intact to orientation, naming, recall and repetition EOL planning: adv directives discussed    Office Visit from 09/20/2019 in [place] at [facility] Total Score  0        Office Visit from 07/04/2018 in [place] for [facility]  PHQ-9 Total Score  4     I have personally reviewed and have noted 1. The patient's medical and social history - reviewed today no changes 2. Their use of alcohol, tobacco or illicit drugs 3. Their current medications and supplements 4. The patient's functional ability including ADL's, fall risks, home safety risks and hearing or visual impairment. 5. Diet and physical activities 6. Evidence for depression or mood disorders 7. Care team reviewed and updated  Patient Care Team: Hoyt Koch, MD as PCP - General (Internal Medicine) Lavonia Drafts, MD (Obstetrics and Gynecology) Past Medical History:  Diagnosis Date  . Bone spur R foot   2014  . Cataract   . GERD (gastroesophageal reflux disease)    On no meds  . Hypertension   . Post-menopausal bleeding 02/18/2011   Past Surgical History:  Procedure Laterality Date  . bone spur removed from left foot  2006  . COLONOSCOPY  2007  . EYE SURGERY     both eyes  . HYSTEROSCOPY WITH D & C  02/18/2011   Procedure: DILATATION AND CURETTAGE (D&C) /HYSTEROSCOPY;  Surgeon: Frederico Hamman, MD;  Location: [facility];  Service: Gynecology;  Laterality: N/A;  . ROBOTIC ASSISTED LAP  VAGINAL HYSTERECTOMY  03/31/11   hyst with SBO due to endometrial cancer   Family History  Problem Relation Age of Onset  . Hyperlipidemia Mother   . Hypertension Mother   . Cancer Father 76       lung cancer  . Hypertension Brother   . Breast cancer Neg Hx    Review of Systems  Constitutional: Negative.   HENT: Negative.   Eyes: Negative.   Respiratory: Negative for cough, chest tightness and shortness of breath.   Cardiovascular: Negative for chest pain, palpitations and leg swelling.  Gastrointestinal: Negative for abdominal distention, abdominal pain, constipation, diarrhea, nausea and vomiting.  Musculoskeletal: Positive for arthralgias.  Skin: Negative.   Neurological: Negative.   Psychiatric/Behavioral: Negative.     Objective:  Physical Exam Constitutional:      Appearance: She is well-developed.  HENT:     Head: Normocephalic and atraumatic.  Cardiovascular:     Rate and Rhythm: Normal rate and regular rhythm.  Pulmonary:     Effort: Pulmonary effort is normal. No respiratory distress.     Breath sounds: Normal breath sounds. No wheezing or rales.  Abdominal:     General: Bowel sounds are normal. There is no distension.     Palpations: Abdomen is soft.     Tenderness: There is no abdominal tenderness. There is no rebound.  Musculoskeletal:     Cervical back: Normal range of motion.  Skin:    General: Skin is warm and dry.  Neurological:  Mental Status: She is alert and oriented to person, place, and time.     Coordination: Coordination normal.     Vitals:   09/20/19 0948  BP: (!) 146/92  Pulse: 75  Temp: 98.1 F (36.7 C)  TempSrc: Oral  SpO2: 98%  Weight: 171 lb (77.6 kg)  Height: 5\' 4"  (1.626 m)   This visit occurred during the SARS-CoV-2 public health emergency.  Safety protocols were in place, including screening questions prior to the visit, additional usage of staff PPE, and extensive cleaning of exam room while observing appropriate  contact time as indicated for disinfecting solutions.   Assessment & Plan:  Mail physical copy labs

## 2019-09-21 NOTE — Assessment & Plan Note (Signed)
Continue annual follow up gyn.

## 2019-09-21 NOTE — Assessment & Plan Note (Signed)
Flu shot up to date. Pneumonia complete. Shingrix counseled. Tetanus due 2023. Colonoscopy due 2028. Mammogram due 2022, pap smear not indicated and dexa due 2024. Counseled about sun safety and mole surveillance. Counseled about the dangers of distracted driving. Given 10 year screening recommendations.

## 2019-09-21 NOTE — Assessment & Plan Note (Signed)
BP at goal of <150/90. Continue lisinopril/hctz. Checking CMP and adjust as needed.

## 2019-09-26 ENCOUNTER — Other Ambulatory Visit: Payer: Self-pay

## 2019-09-26 ENCOUNTER — Ambulatory Visit
Admission: RE | Admit: 2019-09-26 | Discharge: 2019-09-26 | Disposition: A | Discharge status: Home / Self Care | Payer: Medicare HMO | Source: Ambulatory Visit | Attending: Internal Medicine | Admitting: Internal Medicine

## 2019-09-26 DIAGNOSIS — Z1231 Encounter for screening mammogram for malignant neoplasm of breast: Secondary | ICD-10-CM | POA: Diagnosis not present

## 2019-12-29 DIAGNOSIS — H43393 Other vitreous opacities, bilateral: Secondary | ICD-10-CM | POA: Diagnosis not present

## 2019-12-29 DIAGNOSIS — H40033 Anatomical narrow angle, bilateral: Secondary | ICD-10-CM | POA: Diagnosis not present

## 2020-02-12 ENCOUNTER — Encounter: Payer: Self-pay | Admitting: Cardiology

## 2020-08-01 ENCOUNTER — Encounter: Payer: Self-pay | Admitting: Obstetrics & Gynecology

## 2020-08-01 ENCOUNTER — Other Ambulatory Visit: Payer: Self-pay

## 2020-08-01 ENCOUNTER — Ambulatory Visit (INDEPENDENT_AMBULATORY_CARE_PROVIDER_SITE_OTHER): Payer: Medicare HMO | Admitting: Obstetrics & Gynecology

## 2020-08-01 VITALS — BP 162/83 | HR 80 | Ht 64.0 in | Wt 174.0 lb

## 2020-08-01 DIAGNOSIS — Z01419 Encounter for gynecological examination (general) (routine) without abnormal findings: Secondary | ICD-10-CM | POA: Diagnosis not present

## 2020-08-01 NOTE — Progress Notes (Signed)
Subjective:     [name] is a 74 y.o. female here for a routine exam.  Current complaints: no GYN complaints. Pt reports that her health has been good. 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 Her husband finally filed the legal separation papers although she has been separated for a total of 3 years already.   She is scheduled to see her primary care provider next week.    Gynecologic History No LMP recorded. Patient has had a hysterectomy. Contraception: post menopausal status Last Pap: n/a s/p hyst Last mammogram: 09/26/2019 WNL  Obstetric History OB History  Gravida Para Term Preterm AB Living  1 1 1    0 1  SAB IAB Ectopic Multiple Live Births  0       1    # Outcome Date GA Lbr Len/2nd Weight Sex Delivery Anes PTL Lv  1 Term 08/12/65    M Vag-Spont   LIV   The following portions of the patient's history were reviewed and updated as appropriate: allergies, current medications, past family history, past medical history, past social history, past surgical history and problem list.  Review of Systems Pertinent items are noted in HPI.    Objective:  BP (!) 162/83   Pulse 80   Ht 5\' 4"  (1.626 m)   Wt 174 lb (78.9 kg)   BMI 29.87 kg/m  General Appearance:    Alert, cooperative, no distress, appears stated age  Head:    Normocephalic, without obvious abnormality, atraumatic  Eyes:    conjunctiva/corneas clear, EOM's intact, both eyes  Ears:    Normal external ear canals, both ears  Nose:   Nares normal, septum midline, mucosa normal, no drainage    or sinus tenderness  Throat:   Lips, mucosa, and tongue normal; teeth and gums normal  Neck:   Supple, symmetrical, trachea midline, no adenopathy;    thyroid:  no enlargement/tenderness/nodules  Back:     Symmetric, no curvature, ROM normal, no CVA tenderness  Lungs:     respirations unlabored  Chest Wall:    No tenderness or deformity   Heart:    Regular rate and rhythm  Breast  Exam:    No tenderness, masses, or nipple abnormality  Abdomen:     Soft, non-tender, bowel sounds active all four quadrants,    no masses, no organomegaly  Genitalia:    Normal female without lesion, discharge or tenderness   Uterus and cervix surgically absent. No adnexal masses.  Extremities:   Extremities normal, atraumatic, no cyanosis or edema  Pulses:   2+ and symmetric all extremities  Skin:   Skin color, texture, turgor normal, no rashes or lesions     Assessment:    Healthy female exam.    Plan:    mammogram in Feb 2022 F/u in 1 year or sooner prn  [name] L. Harraway-Smith, M.D., 

## 2020-08-01 NOTE — Progress Notes (Signed)
Patient presents for annual exam.  Last mammogram 09-26-19- normal. Armandina Stammer RN

## 2020-08-20 ENCOUNTER — Ambulatory Visit: Payer: Medicare HMO

## 2020-08-22 ENCOUNTER — Other Ambulatory Visit: Payer: Self-pay | Admitting: Internal Medicine

## 2020-08-22 DIAGNOSIS — Z1231 Encounter for screening mammogram for malignant neoplasm of breast: Secondary | ICD-10-CM

## 2020-09-12 ENCOUNTER — Other Ambulatory Visit: Payer: Self-pay

## 2020-09-12 ENCOUNTER — Ambulatory Visit: Payer: Medicare HMO | Attending: Internal Medicine

## 2020-09-12 DIAGNOSIS — Z23 Encounter for immunization: Secondary | ICD-10-CM

## 2020-09-12 NOTE — Progress Notes (Signed)
   Covid-19 Vaccination Clinic  Name:  [name]    MRN: [id number] DOB: July 30, 1946  09/12/2020  [name] was observed post Covid-19 immunization for 15 minutes without incident. She was provided with Vaccine Information Sheet and instruction to access the V-Safe system.   [name] was instructed to call 911 with any severe reactions post vaccine: Marland Kitchen Difficulty breathing  . Swelling of face and throat  . A fast heartbeat  . A bad rash all over body  . Dizziness and weakness   Immunizations Administered    Name Date Dose VIS Date Route   Moderna Covid-19 Booster Vaccine 09/12/2020  1:21 PM 0.25 mL 05/22/2020 Intramuscular   Manufacturer: Moderna   Lot: [id number]   Mar-Mac: 74451-460-47

## 2020-09-20 ENCOUNTER — Encounter: Payer: Medicare HMO | Admitting: Internal Medicine

## 2020-09-26 ENCOUNTER — Ambulatory Visit (INDEPENDENT_AMBULATORY_CARE_PROVIDER_SITE_OTHER): Payer: Medicare HMO | Admitting: Internal Medicine

## 2020-09-26 ENCOUNTER — Encounter: Payer: Self-pay | Admitting: Internal Medicine

## 2020-09-26 ENCOUNTER — Other Ambulatory Visit: Payer: Self-pay

## 2020-09-26 VITALS — BP 132/72 | HR 77 | Temp 98.1°F | Resp 18 | Ht 64.0 in | Wt 173.8 lb

## 2020-09-26 DIAGNOSIS — I10 Essential (primary) hypertension: Secondary | ICD-10-CM

## 2020-09-26 DIAGNOSIS — Z0000 Encounter for general adult medical examination without abnormal findings: Secondary | ICD-10-CM | POA: Diagnosis not present

## 2020-09-26 DIAGNOSIS — Z8542 Personal history of malignant neoplasm of other parts of uterus: Secondary | ICD-10-CM

## 2020-09-26 LAB — LIPID PANEL
Cholesterol: 189 mg/dL (ref 0–200)
HDL: 79.9 mg/dL (ref >39.00)
LDL Cholesterol: 97 mg/dL (ref 0–99)
NonHDL: 108.75
Total CHOL/HDL Ratio: 2
Triglycerides: 61 mg/dL (ref 0.0–149.0)
VLDL: 12.2 mg/dL (ref 0.0–40.0)

## 2020-09-26 LAB — COMPREHENSIVE METABOLIC PANEL
ALT: 10 U/L (ref 0–35)
AST: 19 U/L (ref 0–37)
Albumin: 4 g/dL (ref 3.5–5.2)
Alkaline Phosphatase: 45 U/L (ref 39–117)
BUN: 14 mg/dL (ref 6–23)
CO2: 29 mEq/L (ref 19–32)
Calcium: 9.6 mg/dL (ref 8.4–10.5)
Chloride: 103 mEq/L (ref 96–112)
Creatinine, Ser: 0.9 mg/dL (ref 0.40–1.20)
GFR: 63.29 mL/min (ref >60.00)
Glucose, Bld: 101 mg/dL — ABNORMAL HIGH (ref 70–99)
Potassium: 4.5 mEq/L (ref 3.5–5.1)
Sodium: 137 mEq/L (ref 135–145)
Total Bilirubin: 0.5 mg/dL (ref 0.2–1.2)
Total Protein: 7.9 g/dL (ref 6.0–8.3)

## 2020-09-26 LAB — CBC
HCT: 40.2 % (ref 36.0–46.0)
Hemoglobin: 13.4 g/dL (ref 12.0–15.0)
MCHC: 33.3 g/dL (ref 30.0–36.0)
MCV: 91.1 fl (ref 78.0–100.0)
Platelets: 200 10*3/uL (ref 150.0–400.0)
RBC: 4.41 Mil/uL (ref 3.87–5.11)
RDW: 13.7 % (ref 11.5–15.5)
WBC: 4.5 10*3/uL (ref 4.0–10.5)

## 2020-09-26 MED ORDER — LISINOPRIL-HYDROCHLOROTHIAZIDE 20-25 MG PO TABS: 1.0000 | ORAL_TABLET | Freq: Every day | ORAL | 3 refills | Status: DC

## 2020-09-26 NOTE — Assessment & Plan Note (Signed)
Flu shot up to date. Covid-19 up to date including booster Pneumonia complete. Shingrix counseled due. Tetanus due 2023. Colonoscopy due 2028. Mammogram scheduled March 2022, pap smear aged out and dexa due 2024. Counseled about sun safety and mole surveillance. Counseled about the dangers of distracted driving. Given 10 year screening recommendations.

## 2020-09-26 NOTE — Patient Instructions (Signed)
Think about getting the shingles shot.   Health Maintenance, Female Adopting a healthy lifestyle and getting preventive care are important in promoting health and wellness. Ask your health care provider about:  The right schedule for you to have regular tests and exams.  Things you can do on your own to prevent diseases and keep yourself healthy. What should I know about diet, weight, and exercise? Eat a healthy diet  Eat a diet that includes plenty of vegetables, fruits, low-fat dairy products, and lean protein.  Do not eat a lot of foods that are high in solid fats, added sugars, or sodium.   Maintain a healthy weight Body mass index (BMI) is used to identify weight problems. It estimates body fat based on height and weight. Your health care provider can help determine your BMI and help you achieve or maintain a healthy weight. Get regular exercise Get regular exercise. This is one of the most important things you can do for your health. Most adults should:  Exercise for at least 150 minutes each week. The exercise should increase your heart rate and make you sweat (moderate-intensity exercise).  Do strengthening exercises at least twice a week. This is in addition to the moderate-intensity exercise.  Spend less time sitting. Even light physical activity can be beneficial. Watch cholesterol and blood lipids Have your blood tested for lipids and cholesterol at 74 years of age, then have this test every 5 years. Have your cholesterol levels checked more often if:  Your lipid or cholesterol levels are high.  You are older than 74 years of age.  You are at high risk for heart disease. What should I know about cancer screening? Depending on your health history and family history, you may need to have cancer screening at various ages. This may include screening for:  Breast cancer.  Cervical cancer.  Colorectal cancer.  Skin cancer.  Lung cancer. What should I know about  heart disease, diabetes, and high blood pressure? Blood pressure and heart disease  High blood pressure causes heart disease and increases the risk of stroke. This is more likely to develop in people who have high blood pressure readings, are of African descent, or are overweight.  Have your blood pressure checked: ? Every 3-5 years if you are 62-57 years of age. ? Every year if you are 62 years old or older. Diabetes Have regular diabetes screenings. This checks your fasting blood sugar level. Have the screening done:  Once every three years after age 46 if you are at a normal weight and have a low risk for diabetes.  More often and at a younger age if you are overweight or have a high risk for diabetes. What should I know about preventing infection? Hepatitis B If you have a higher risk for hepatitis B, you should be screened for this virus. Talk with your health care provider to find out if you are at risk for hepatitis B infection. Hepatitis C Testing is recommended for:  Everyone born from [birth year] through 1964.  Anyone with known risk factors for hepatitis C. Sexually transmitted infections (STIs)  Get screened for STIs, including gonorrhea and chlamydia, if: ? You are sexually active and are younger than 74 years of age. ? You are older than 74 years of age and your health care provider tells you that you are at risk for this type of infection. ? Your sexual activity has changed since you were last screened, and you are at increased risk for  chlamydia or gonorrhea. Ask your health care provider if you are at risk.  Ask your health care provider about whether you are at high risk for HIV. Your health care provider may recommend a prescription medicine to help prevent HIV infection. If you choose to take medicine to prevent HIV, you should first get tested for HIV. You should then be tested every 3 months for as long as you are taking the medicine. Pregnancy  If you are about to  stop having your period (premenopausal) and you may become pregnant, seek counseling before you get pregnant.  Take 400 to 800 micrograms (mcg) of folic acid every day if you become pregnant.  Ask for birth control (contraception) if you want to prevent pregnancy. Osteoporosis and menopause Osteoporosis is a disease in which the bones lose minerals and strength with aging. This can result in bone fractures. If you are 63 years old or older, or if you are at risk for osteoporosis and fractures, ask your health care provider if you should:  Be screened for bone loss.  Take a calcium or vitamin D supplement to lower your risk of fractures.  Be given hormone replacement therapy (HRT) to treat symptoms of menopause. Follow these instructions at home: Lifestyle  Do not use any products that contain nicotine or tobacco, such as cigarettes, e-cigarettes, and chewing tobacco. If you need help quitting, ask your health care provider.  Do not use street drugs.  Do not share needles.  Ask your health care provider for help if you need support or information about quitting drugs. Alcohol use  Do not drink alcohol if: ? Your health care provider tells you not to drink. ? You are pregnant, may be pregnant, or are planning to become pregnant.  If you drink alcohol: ? Limit how much you use to 0-1 drink a day. ? Limit intake if you are breastfeeding.  Be aware of how much alcohol is in your drink. In the [place] one drink equals one 12 oz bottle of beer (355 mL), one 5 oz glass of wine (148 mL), or one 1 oz glass of hard liquor (44 mL). General instructions  Schedule regular health, dental, and eye exams.  Stay current with your vaccines.  Tell your health care provider if: ? You often feel depressed. ? You have ever been abused or do not feel safe at home. Summary  Adopting a healthy lifestyle and getting preventive care are important in promoting health and wellness.  Follow your  health care provider's instructions about healthy diet, exercising, and getting tested or screened for diseases.  Follow your health care provider's instructions on monitoring your cholesterol and blood pressure. This information is not intended to replace advice given to you by your health care provider. Make sure you discuss any questions you have with your health care provider. Document Revised: 07/13/2018 Document Reviewed: 07/13/2018 Elsevier Patient Education  2021 [employer].

## 2020-09-26 NOTE — Assessment & Plan Note (Signed)
Continue annual gyn follow up.

## 2020-09-26 NOTE — Progress Notes (Signed)
Subjective:   Patient ID: [name], female    DOB: 1946/07/12, 75 y.o.   MRN: [id number]  HPI Here for medicare wellness, no new complaints. Please see A/P for status and treatment of chronic medical problems.   Diet: heart healthy Physical activity: sedentary Depression/mood screen: negative Hearing: intact to whispered voice Visual acuity: grossly normal, performs annual eye exam  ADLs: capable Fall risk: none Home safety: good Cognitive evaluation: intact to orientation, naming, recall and repetition EOL planning: adv directives discussed  [place] Visit from 09/26/2020 in [place] at [facility]  PHQ-2 Total Score [place] Office Visit from 07/04/2018 in [place] for [facility]  PHQ-9 Total Score 4     I have personally reviewed and have noted 1. The patient's medical and social history - reviewed today no changes 2. Their use of alcohol, tobacco or illicit drugs 3. Their current medications and supplements 4. The patient's functional ability including ADL's, fall risks, home safety risks and hearing or visual impairment. 5. Diet and physical activities 6. Evidence for depression or mood disorders 7. Care team reviewed and updated 8.  The patient is not on an opioid pain medication.  Patient Care Team: Hoyt Koch, MD as PCP - General (Internal Medicine) Lavonia Drafts, MD (Obstetrics and Gynecology) Past Medical History:  Diagnosis Date  . Bone spur R foot   2014  . Cataract   . GERD (gastroesophageal reflux disease)    On no meds  . Hypertension   . Post-menopausal bleeding 02/18/2011   Past Surgical History:  Procedure Laterality Date  . bone spur removed from left foot  2006  . COLONOSCOPY  2007  . EYE SURGERY     both eyes  . HYSTEROSCOPY WITH D & C  02/18/2011   Procedure: DILATATION AND CURETTAGE (D&C) /HYSTEROSCOPY;  Surgeon: Frederico Hamman, MD;  Location: [facility];  Service:  Gynecology;  Laterality: N/A;  . ROBOTIC ASSISTED LAP VAGINAL HYSTERECTOMY  03/31/11   hyst with SBO due to endometrial cancer   Family History  Problem Relation Age of Onset  . Hyperlipidemia Mother   . Hypertension Mother   . Cancer Father 80       lung cancer  . Hypertension Brother   . Breast cancer Neg Hx    Review of Systems  Constitutional: Negative.   HENT: Negative.   Eyes: Negative.   Respiratory: Negative for cough, chest tightness and shortness of breath.   Cardiovascular: Negative for chest pain, palpitations and leg swelling.  Gastrointestinal: Negative for abdominal distention, abdominal pain, constipation, diarrhea, nausea and vomiting.  Musculoskeletal: Negative.   Skin: Negative.   Neurological: Negative.   Psychiatric/Behavioral: Negative.     Objective:  Physical Exam Constitutional:      Appearance: She is well-developed and well-nourished.  HENT:     Head: Normocephalic and atraumatic.  Eyes:     Extraocular Movements: EOM normal.  Cardiovascular:     Rate and Rhythm: Normal rate and regular rhythm.  Pulmonary:     Effort: Pulmonary effort is normal. No respiratory distress.     Breath sounds: Normal breath sounds. No wheezing or rales.  Abdominal:     General: Bowel sounds are normal. There is no distension.     Palpations: Abdomen is soft.     Tenderness: There is no abdominal tenderness. There is no rebound.  Musculoskeletal:        General: No edema.  Cervical back: Normal range of motion.  Skin:    General: Skin is warm and dry.  Neurological:     Mental Status: She is alert and oriented to person, place, and time.     Coordination: Coordination normal.  Psychiatric:        Mood and Affect: Mood and affect normal.     Vitals:   09/26/20 0919  BP: 132/72  Pulse: 77  Resp: 18  Temp: 98.1 F (36.7 C)  TempSrc: Oral  SpO2: 97%  Weight: 173 lb 12.8 oz (78.8 kg)  Height: 5\' 4"  (1.626 m)    This visit occurred during the  SARS-CoV-2 public health emergency.  Safety protocols were in place, including screening questions prior to the visit, additional usage of staff PPE, and extensive cleaning of exam room while observing appropriate contact time as indicated for disinfecting solutions.   Assessment & Plan:

## 2020-09-26 NOTE — Assessment & Plan Note (Signed)
BP at goal on lisinopril/hctz. Checking CMP and adjust as needed. Refilled today.

## 2020-10-04 ENCOUNTER — Other Ambulatory Visit: Payer: Self-pay

## 2020-10-04 ENCOUNTER — Ambulatory Visit
Admission: RE | Admit: 2020-10-04 | Discharge: 2020-10-04 | Disposition: A | Discharge status: Home / Self Care | Payer: Medicare HMO | Source: Ambulatory Visit | Attending: Internal Medicine | Admitting: Internal Medicine

## 2020-10-04 DIAGNOSIS — Z1231 Encounter for screening mammogram for malignant neoplasm of breast: Secondary | ICD-10-CM

## 2020-11-29 ENCOUNTER — Ambulatory Visit
Admission: RE | Admit: 2020-11-29 | Discharge: 2020-11-29 | Disposition: A | Discharge status: Home / Self Care | Payer: Medicare HMO | Source: Ambulatory Visit | Attending: Internal Medicine | Admitting: Internal Medicine

## 2020-11-29 ENCOUNTER — Other Ambulatory Visit: Payer: Self-pay

## 2020-11-29 ENCOUNTER — Ambulatory Visit: Payer: Medicare HMO

## 2020-11-29 DIAGNOSIS — Z1231 Encounter for screening mammogram for malignant neoplasm of breast: Secondary | ICD-10-CM | POA: Diagnosis not present

## 2021-02-06 DIAGNOSIS — H43393 Other vitreous opacities, bilateral: Secondary | ICD-10-CM | POA: Diagnosis not present

## 2021-02-06 DIAGNOSIS — H40033 Anatomical narrow angle, bilateral: Secondary | ICD-10-CM | POA: Diagnosis not present

## 2021-06-17 ENCOUNTER — Telehealth: Payer: Self-pay | Admitting: General Practice

## 2021-06-17 NOTE — Telephone Encounter (Signed)
Called patient to schedule Annual exam with Dr. Ihor Dow for January 2023.  Unable to leave a message on voice mail.

## 2021-08-27 ENCOUNTER — Ambulatory Visit (INDEPENDENT_AMBULATORY_CARE_PROVIDER_SITE_OTHER): Payer: Medicare HMO | Admitting: Obstetrics & Gynecology

## 2021-08-27 ENCOUNTER — Encounter: Payer: Self-pay | Admitting: Obstetrics & Gynecology

## 2021-08-27 ENCOUNTER — Other Ambulatory Visit: Payer: Self-pay

## 2021-08-27 VITALS — BP 135/75 | HR 71 | Ht 65.0 in | Wt 173.0 lb

## 2021-08-27 DIAGNOSIS — Z1231 Encounter for screening mammogram for malignant neoplasm of breast: Secondary | ICD-10-CM

## 2021-08-27 DIAGNOSIS — Z01419 Encounter for gynecological examination (general) (routine) without abnormal findings: Secondary | ICD-10-CM

## 2021-08-27 NOTE — Progress Notes (Signed)
Subjective:     COURTLYN AKI is a 76 y.o. female here for a routine exam.  Current complaints: none.      Gynecologic History No LMP recorded. Patient has had a hysterectomy. Contraception: post menopausal status Last Pap: s/p hyst.  Last mammogram: 11/29/2020. Results were: normal  Obstetric History OB History  Gravida Para Term Preterm AB Living  1 1 1    0 1  SAB IAB Ectopic Multiple Live Births  0       1    # Outcome Date GA Lbr Len/2nd Weight Sex Delivery Anes PTL Lv  1 Term 08/12/65    M Vag-Spont   LIV     The following portions of the patient's history were reviewed and updated as appropriate: allergies, current medications, past family history, past medical history, past social history, past surgical history, and problem list.  Review of Systems Pertinent items are noted in HPI.    Objective:  BP 135/75    Pulse 71    Ht 5\' 5"  (1.651 m)    Wt 173 lb (78.5 kg)    BMI 28.79 kg/m  General Appearance:    Alert, cooperative, no distress, appears stated age  Head:    Normocephalic, without obvious abnormality, atraumatic  Eyes:    conjunctiva/corneas clear, EOM's intact, both eyes  Ears:    Normal external ear canals, both ears  Nose:   Nares normal, septum midline, mucosa normal, no drainage    or sinus tenderness  Throat:   Lips, mucosa, and tongue normal; teeth and gums normal  Neck:   Supple, symmetrical, trachea midline, no adenopathy;    thyroid:  no enlargement/tenderness/nodules  Back:     Symmetric, no curvature, ROM normal, no CVA tenderness  Lungs:     respirations unlabored  Chest Wall:    No tenderness or deformity   Heart:    Regular rate and rhythm  Breast Exam:    No tenderness, masses, or nipple abnormality  Abdomen:     Soft, non-tender, bowel sounds active all four quadrants,    no masses, no organomegaly  Genitalia:    Normal female without lesion, discharge or tenderness     Extremities:   Extremities normal, atraumatic, no cyanosis or edema   Pulses:   2+ and symmetric all extremities  Skin:   Skin color, texture, turgor normal, no rashes or lesions     Assessment:    Healthy female exam.    Plan:   Iver was seen today for annual exam.  Diagnoses and all orders for this visit:  Breast cancer screening by mammogram -     MM Digital Screening; Future  Encounter for well woman exam with routine gynecological exam   Pt wants to know if she can f/u in [place] as this ofc is far for her. Will have front desk arrange net visit with [facility] in [place].   Kerry Chisolm L. Harraway-Smith, M.D., [name]

## 2021-08-27 NOTE — Progress Notes (Signed)
Last mammogram- 11/29/20- Normal Pt has no concerns today

## 2021-10-02 ENCOUNTER — Other Ambulatory Visit: Payer: Self-pay

## 2021-10-02 ENCOUNTER — Ambulatory Visit (INDEPENDENT_AMBULATORY_CARE_PROVIDER_SITE_OTHER): Payer: Medicare HMO | Admitting: Internal Medicine

## 2021-10-02 ENCOUNTER — Encounter: Payer: Self-pay | Admitting: Internal Medicine

## 2021-10-02 VITALS — BP 118/74 | HR 62 | Resp 18 | Ht 65.0 in | Wt 172.6 lb

## 2021-10-02 DIAGNOSIS — Z0000 Encounter for general adult medical examination without abnormal findings: Secondary | ICD-10-CM | POA: Diagnosis not present

## 2021-10-02 DIAGNOSIS — R7301 Impaired fasting glucose: Secondary | ICD-10-CM

## 2021-10-02 DIAGNOSIS — I10 Essential (primary) hypertension: Secondary | ICD-10-CM | POA: Diagnosis not present

## 2021-10-02 DIAGNOSIS — Z8542 Personal history of malignant neoplasm of other parts of uterus: Secondary | ICD-10-CM

## 2021-10-02 LAB — COMPREHENSIVE METABOLIC PANEL
ALT: 11 U/L (ref 0–35)
AST: 22 U/L (ref 0–37)
Albumin: 4.2 g/dL (ref 3.5–5.2)
Alkaline Phosphatase: 50 U/L (ref 39–117)
BUN: 16 mg/dL (ref 6–23)
CO2: 27 mEq/L (ref 19–32)
Calcium: 9.8 mg/dL (ref 8.4–10.5)
Chloride: 100 mEq/L (ref 96–112)
Creatinine, Ser: 0.96 mg/dL (ref 0.40–1.20)
GFR: 58.15 mL/min — ABNORMAL LOW (ref >60.00)
Glucose, Bld: 98 mg/dL (ref 70–99)
Potassium: 4.9 mEq/L (ref 3.5–5.1)
Sodium: 134 mEq/L — ABNORMAL LOW (ref 135–145)
Total Bilirubin: 0.5 mg/dL (ref 0.2–1.2)
Total Protein: 8 g/dL (ref 6.0–8.3)

## 2021-10-02 LAB — CBC
HCT: 40.2 % (ref 36.0–46.0)
Hemoglobin: 13.3 g/dL (ref 12.0–15.0)
MCHC: 33.1 g/dL (ref 30.0–36.0)
MCV: 91.9 fl (ref 78.0–100.0)
Platelets: 170 10*3/uL (ref 150.0–400.0)
RBC: 4.37 Mil/uL (ref 3.87–5.11)
RDW: 14.1 % (ref 11.5–15.5)
WBC: 4.3 10*3/uL (ref 4.0–10.5)

## 2021-10-02 LAB — LIPID PANEL
Cholesterol: 209 mg/dL — ABNORMAL HIGH (ref 0–200)
HDL: 82.3 mg/dL (ref >39.00)
LDL Cholesterol: 109 mg/dL — ABNORMAL HIGH (ref 0–99)
NonHDL: 127.11
Total CHOL/HDL Ratio: 3
Triglycerides: 89 mg/dL (ref 0.0–149.0)
VLDL: 17.8 mg/dL (ref 0.0–40.0)

## 2021-10-02 LAB — HEMOGLOBIN A1C: Hgb A1c MFr Bld: 5.9 % (ref 4.6–6.5)

## 2021-10-02 NOTE — Assessment & Plan Note (Signed)
Flu shot up to date. Covid-19 booster encouraged. Pneumonia complete. Shingrix counseled to get at pharmacy. Tetanus due 2023. Colonoscopy due 2028. Mammogram due 2024, pap smear aged out and dexa completed. Counseled about sun safety and mole surveillance. Counseled about the dangers of distracted driving. Given 10 year screening recommendations.  ? ?

## 2021-10-02 NOTE — Assessment & Plan Note (Signed)
Checking CMP and lipid panel blood pressure at goal on lisinopril/hctz 20/25 mg daily. Adjust as needed.  ?

## 2021-10-02 NOTE — Patient Instructions (Signed)
Ask about the covid-19 booster at the pharmacy and the shingles vaccine.  ?

## 2021-10-02 NOTE — Progress Notes (Signed)
? ?Subjective:  ? ?Patient ID: [name], female    DOB: Mar 23, 1946, 76 y.o.   MRN: [id number] ? ?HPI ?Here for medicare wellness and physical, no new complaints. Please see A/P for status and treatment of chronic medical problems.  ? ?Diet: heart healthy  ?Physical activity: sedentary ?Depression/mood screen: negative ?Hearing: intact to whispered voice, mild tinnitus stable ?Visual acuity: grossly normal reading lens, performs annual eye exam  ?ADLs: capable ?Fall risk: none ?Home safety: good ?Cognitive evaluation: intact to orientation, naming, recall and repetition ?EOL planning: adv directives discussed, not in place ? ?[place] Office Visit from 10/02/2021 in [place] at [place]  ?PHQ-2 Total Score 0  ? ?  ?  ?[place] Office Visit from 07/04/2018 in [place] for [facility]  ?PHQ-9 Total Score 4  ? ?  ? ?Fall Risk 08/15/2015 09/12/2018 09/20/2019 09/26/2020 10/02/2021  ?Falls in the past year? No 0 0 0 0  ?Was there an injury with Fall? - - - 0 0  ?Fall Risk Category Calculator - - - 0 0  ?Fall Risk Category - - - Low Low  ? ? ?I have personally reviewed and have noted ?1. The patient's medical and social history - reviewed today no changes ?2. Their use of alcohol, tobacco or illicit drugs ?3. Their current medications and supplements ?4. The patient's functional ability including ADL's, fall risks, home safety risks and hearing or visual impairment. ?5. Diet and physical activities ?6. Evidence for depression or mood disorders ?7. Care team reviewed and updated ?8.  The patient is not on an opioid pain medication. ? ?Patient Care Team: ?Hoyt Koch, MD as PCP - General (Internal Medicine) ?Lavonia Drafts, MD (Obstetrics and Gynecology) ?Past Medical History:  ?Diagnosis Date  ? Bone spur R foot  ? 2014  ? Cataract   ? Cervical cancer ([place])   ? GERD (gastroesophageal reflux disease)   ? On no meds  ? Hypertension   ? Post-menopausal bleeding 02/18/2011   ? ?Past Surgical History:  ?Procedure Laterality Date  ? bone spur removed from left foot  2006  ? COLONOSCOPY  2007  ? EYE SURGERY    ? both eyes  ? HYSTEROSCOPY WITH D & C  02/18/2011  ? Procedure: DILATATION AND CURETTAGE (D&C) /HYSTEROSCOPY;  Surgeon: Frederico Hamman, MD;  Location: [facility];  Service: Gynecology;  Laterality: N/A;  ? ROBOTIC ASSISTED LAP VAGINAL HYSTERECTOMY  03/31/11  ? hyst with SBO due to endometrial cancer  ? ?Family History  ?Problem Relation Age of Onset  ? Hyperlipidemia Mother   ? Hypertension Mother   ? Cancer Father 62  ?     lung cancer  ? Hypertension Brother   ? Breast cancer Neg Hx   ? ?Review of Systems  ?Constitutional: Negative.   ?HENT: Negative.    ?Eyes: Negative.   ?Respiratory:  Negative for cough, chest tightness and shortness of breath.   ?Cardiovascular:  Negative for chest pain, palpitations and leg swelling.  ?Gastrointestinal:  Negative for abdominal distention, abdominal pain, constipation, diarrhea, nausea and vomiting.  ?Musculoskeletal: Negative.   ?Skin: Negative.   ?Neurological: Negative.   ?Psychiatric/Behavioral: Negative.    ? ?Objective:  ?Physical Exam ?Constitutional:   ?   Appearance: She is well-developed.  ?HENT:  ?   Head: Normocephalic and atraumatic.  ?Cardiovascular:  ?   Rate and Rhythm: Normal rate and regular rhythm.  ?Pulmonary:  ?   Effort: Pulmonary effort is normal. No respiratory  distress.  ?   Breath sounds: Normal breath sounds. No wheezing or rales.  ?Abdominal:  ?   General: Bowel sounds are normal. There is no distension.  ?   Palpations: Abdomen is soft.  ?   Tenderness: There is no abdominal tenderness. There is no rebound.  ?Musculoskeletal:  ?   Cervical back: Normal range of motion.  ?Skin: ?   General: Skin is warm and dry.  ?Neurological:  ?   Mental Status: She is alert and oriented to person, place, and time.  ?   Coordination: Coordination normal.  ? ? ?Vitals:  ? 10/02/21 0952  ?BP: 118/74  ?Pulse: 62  ?Resp: 18  ?SpO2: 96%   ?Weight: 172 lb 9.6 oz (78.3 kg)  ?Height: 5\' 5"  (1.651 m)  ? ?This visit occurred during the SARS-CoV-2 public health emergency.  Safety protocols were in place, including screening questions prior to the visit, additional usage of staff PPE, and extensive cleaning of exam room while observing appropriate contact time as indicated for disinfecting solutions.  ? ?Assessment & Plan:  ?Mail labs ?

## 2021-10-02 NOTE — Assessment & Plan Note (Signed)
Continue annual gyn follow up. No signs of recurrence. ?

## 2021-10-02 NOTE — Progress Notes (Signed)
This encounter was created in error - please disregard.

## 2021-10-08 ENCOUNTER — Telehealth: Payer: Self-pay

## 2021-10-08 NOTE — Telephone Encounter (Signed)
Noted  

## 2021-10-08 NOTE — Telephone Encounter (Signed)
Pt returned the call for results. I advised pt of Dr. Nathanial Millman result note stating that Normal/stable labs. Sugars are still pre-diabetes but stable from before. No changes needed. ? ?Pt understood and had no further questions ? ?FYI ?

## 2021-11-25 ENCOUNTER — Telehealth: Payer: Self-pay

## 2021-11-25 NOTE — Telephone Encounter (Signed)
Pt is calling for a refill on: ?lisinopril-hydrochlorothiazide (ZESTORETIC) 20-25 MG tablet ? ?Pharmacy: ?[facility], [place] ? ?LOV 10/02/21 ?ROV will call back to schedule closer to 3/24 ?

## 2021-11-26 MED ORDER — LISINOPRIL-HYDROCHLOROTHIAZIDE 20-25 MG PO TABS: 1.0000 | ORAL_TABLET | Freq: Every day | ORAL | 3 refills | Status: DC

## 2021-11-26 NOTE — Telephone Encounter (Signed)
Refill has been sent to the pt's mail order pharmacy. ?

## 2021-12-03 ENCOUNTER — Ambulatory Visit: Payer: Medicare HMO

## 2021-12-08 ENCOUNTER — Ambulatory Visit: Payer: Medicare HMO

## 2021-12-10 ENCOUNTER — Ambulatory Visit
Admission: RE | Admit: 2021-12-10 | Discharge: 2021-12-10 | Disposition: A | Discharge status: Home / Self Care | Payer: Medicare HMO | Source: Ambulatory Visit | Attending: Obstetrics & Gynecology | Admitting: Obstetrics & Gynecology

## 2021-12-10 DIAGNOSIS — Z1231 Encounter for screening mammogram for malignant neoplasm of breast: Secondary | ICD-10-CM

## 2022-08-31 ENCOUNTER — Ambulatory Visit (INDEPENDENT_AMBULATORY_CARE_PROVIDER_SITE_OTHER): Payer: Medicare HMO | Admitting: Family Medicine

## 2022-08-31 ENCOUNTER — Encounter: Payer: Self-pay | Admitting: Family Medicine

## 2022-08-31 VITALS — BP 142/79 | HR 66 | Ht 64.0 in | Wt 167.6 lb

## 2022-08-31 DIAGNOSIS — Z0000 Encounter for general adult medical examination without abnormal findings: Secondary | ICD-10-CM

## 2022-08-31 DIAGNOSIS — Z1231 Encounter for screening mammogram for malignant neoplasm of breast: Secondary | ICD-10-CM

## 2022-08-31 NOTE — Progress Notes (Signed)
Pt presents for annual exam. Last mammogram 12/10/21. Denies any abnormal breast changes. Denies any abnormal vaginal bleeding, pain, odor. Last colonoscopy 08/12/16. Pt has no concerns at this time.

## 2022-08-31 NOTE — Progress Notes (Signed)
ANNUAL EXAM Patient name: [name] MRN [id number]  Date of birth: 1946-06-26 Chief Complaint:   Annual Exam  History of Present Illness:   [name] is a 77 y.o. G48P1001 African-American female being seen today for a routine annual exam.  Current complaints: none  No LMP recorded. Patient has had a hysterectomy.  The pregnancy intention screening data noted above was reviewed. Potential methods of contraception were discussed. The patient elected to proceed with No data recorded.   Last mammogram 12/10/21. Normal Denies any abnormal breast changes. Denies any abnormal vaginal bleeding, pain, odor.  Last colonoscopy 08/12/16. Normal Hysterectomy in 2012     08/31/2022    8:28 AM 10/02/2021   10:01 AM 09/26/2020    9:24 AM 09/20/2019    9:52 AM 09/12/2018    9:14 AM  Depression screen PHQ 2/9  Decreased Interest 0 0 0 0 0  Down, Depressed, Hopeless 1 0 0 0 0  PHQ - 2 Score 1 0 0 0 0  Altered sleeping 1      Tired, decreased energy 0      Change in appetite 0      Feeling bad or failure about yourself  0      Trouble concentrating 0      Moving slowly or fidgety/restless 0      Suicidal thoughts 0      PHQ-9 Score 2            08/31/2022    8:32 AM 07/04/2018    3:21 PM 06/16/2017    8:33 AM  GAD 7 : Generalized Anxiety Score  Nervous, Anxious, on Edge 1 0 0  Control/stop worrying 0 0 0  Worry too much - different things 0 2 1  Trouble relaxing 0 0 0  Restless 0 0 0  Easily annoyed or irritable 1 2 0  Afraid - awful might happen 0 0 0  Total GAD 7 Score '2 4 1     '$ Review of Systems:   Pertinent items are noted in HPI Denies any headaches, blurred vision, fatigue, shortness of breath, chest pain, abdominal pain, abnormal vaginal discharge/itching/odor/irritation, problems with periods, bowel movements, urination, or intercourse unless otherwise stated above. Pertinent History Reviewed:  Reviewed past medical,surgical, social and family history.  Reviewed  problem list, medications and allergies. Physical Assessment:   Vitals:   08/31/22 0823  BP: (!) 150/83  Pulse: 65  Weight: 167 lb 9.6 oz (76 kg)  Height: '5\' 4"'$  (1.626 m)  Body mass index is 28.77 kg/m.        Physical Examination:   General appearance - well appearing, and in no distress  Mental status - alert, oriented to person, place, and time  Psych:  She has a normal mood and affect  Skin - warm and dry, normal color, no suspicious lesions noted  Chest - effort normal, all lung fields clear to auscultation bilaterally  Heart - normal rate and regular rhythm  Neck:  midline trachea, no thyromegaly or nodules  Breasts - breasts appear normal, no suspicious masses, no skin or nipple changes or  axillary nodes  Abdomen - soft, nontender, nondistended, no masses or organomegaly  Pelvic - VULVA: normal appearing vulva with no masses, tenderness or lesions  VAGINA: normal appearing vagina with normal color and discharge, no lesions  CERVIX: normal appearing cervix without discharge or lesions, no CMT  Thin prep pap is not done  UTERUS: uterus is felt to be normal  size, shape, consistency and nontender   ADNEXA: No adnexal masses or tenderness noted.  Rectal - normal rectal, good sphincter tone, no masses felt. Hemoccult: none  Extremities:  No swelling or varicosities noted  Chaperone present for exam  No results found for this or any previous visit (from the past 24 hour(s)).  Assessment & Plan:  1) Well-Woman Exam Breast exam nml/ Ordered Mammo for May 2024  Mammogram:  per orders , or sooner if problems Colonoscopy: in 2028  Shelda Pal, DO 08/31/2022 8:44 AM

## 2022-10-05 ENCOUNTER — Ambulatory Visit (INDEPENDENT_AMBULATORY_CARE_PROVIDER_SITE_OTHER): Payer: Medicare HMO | Admitting: Internal Medicine

## 2022-10-05 ENCOUNTER — Encounter: Payer: Self-pay | Admitting: Internal Medicine

## 2022-10-05 VITALS — BP 138/80 | HR 70 | Temp 98.3°F | Ht 64.0 in | Wt 168.1 lb

## 2022-10-05 DIAGNOSIS — Z0000 Encounter for general adult medical examination without abnormal findings: Secondary | ICD-10-CM | POA: Diagnosis not present

## 2022-10-05 DIAGNOSIS — I10 Essential (primary) hypertension: Secondary | ICD-10-CM

## 2022-10-05 DIAGNOSIS — R7303 Prediabetes: Secondary | ICD-10-CM | POA: Diagnosis not present

## 2022-10-05 DIAGNOSIS — Z8542 Personal history of malignant neoplasm of other parts of uterus: Secondary | ICD-10-CM | POA: Diagnosis not present

## 2022-10-05 LAB — CBC
HCT: 38 % (ref 36.0–46.0)
Hemoglobin: 12.6 g/dL (ref 12.0–15.0)
MCHC: 33.2 g/dL (ref 30.0–36.0)
MCV: 91.2 fl (ref 78.0–100.0)
Platelets: 199 10*3/uL (ref 150.0–400.0)
RBC: 4.17 Mil/uL (ref 3.87–5.11)
RDW: 13.8 % (ref 11.5–15.5)
WBC: 4.4 10*3/uL (ref 4.0–10.5)

## 2022-10-05 LAB — COMPREHENSIVE METABOLIC PANEL
ALT: 10 U/L (ref 0–35)
AST: 21 U/L (ref 0–37)
Albumin: 3.6 g/dL (ref 3.5–5.2)
Alkaline Phosphatase: 40 U/L (ref 39–117)
BUN: 23 mg/dL (ref 6–23)
CO2: 28 mEq/L (ref 19–32)
Calcium: 9.7 mg/dL (ref 8.4–10.5)
Chloride: 103 mEq/L (ref 96–112)
Creatinine, Ser: 0.95 mg/dL (ref 0.40–1.20)
GFR: 58.48 mL/min — ABNORMAL LOW (ref >60.00)
Glucose, Bld: 92 mg/dL (ref 70–99)
Potassium: 4.4 mEq/L (ref 3.5–5.1)
Sodium: 137 mEq/L (ref 135–145)
Total Bilirubin: 0.5 mg/dL (ref 0.2–1.2)
Total Protein: 7.2 g/dL (ref 6.0–8.3)

## 2022-10-05 LAB — HEMOGLOBIN A1C: Hgb A1c MFr Bld: 6.1 % (ref 4.6–6.5)

## 2022-10-05 LAB — LIPID PANEL
Cholesterol: 167 mg/dL (ref 0–200)
HDL: 72 mg/dL (ref >39.00)
LDL Cholesterol: 85 mg/dL (ref 0–99)
NonHDL: 95.12
Total CHOL/HDL Ratio: 2
Triglycerides: 52 mg/dL (ref 0.0–149.0)
VLDL: 10.4 mg/dL (ref 0.0–40.0)

## 2022-10-05 MED ORDER — LISINOPRIL-HYDROCHLOROTHIAZIDE 20-25 MG PO TABS: 1.0000 | ORAL_TABLET | Freq: Every day | ORAL | 3 refills | Status: DC

## 2022-10-05 NOTE — Progress Notes (Signed)
Subjective:   Patient ID: [name], female    DOB: 1946/07/22, 77 y.o.   MRN: [id number]  HPI Here for medicare wellness and physical, no new complaints. Please see A/P for status and treatment of chronic medical problems.   Diet: heart healthy Physical activity: sedentary Depression/mood screen: negative Hearing: intact to whispered voice Visual acuity: grossly normal with lens, overdue for annual eye exam  ADLs: capable Fall risk: none Home safety: good Cognitive evaluation: intact to orientation, naming, recall and repetition EOL planning: adv directives discussed, not in place  [employer] Visit from 10/05/2022 in [place] at [facility] Office Visit from 10/05/2022 in [place] at [place]  PHQ-9 Total Score 0         09/12/2018    9:13 AM 09/20/2019    9:51 AM 09/26/2020    9:24 AM 10/02/2021   10:02 AM 10/05/2022    9:48 AM  [place] in the past year? 0 0 0 0 0  Was there an injury with Fall?   0 0 0  Fall Risk Category Calculator   0 0 0  Fall Risk Category (Retired)   Low Low   Fall risk Follow up     Falls evaluation completed    I have personally reviewed and have noted 1. The patient's medical and social history - reviewed today no changes 2. Their use of alcohol, tobacco or illicit drugs 3. Their current medications and supplements 4. The patient's functional ability including ADL's, fall risks, home safety risks and hearing or visual impairment. 5. Diet and physical activities 6. Evidence for depression or mood disorders 7. Care team reviewed and updated 8.  The patient is not on an opioid pain medication.  Patient Care Team: Hoyt Koch, MD as PCP - General (Internal Medicine) Lavonia Drafts, MD (Obstetrics and Gynecology) Past Medical History:  Diagnosis Date   Bone spur R foot   2014   Cataract    Cervical cancer [facility])     GERD (gastroesophageal reflux disease)    On no meds   Hypertension    Post-menopausal bleeding 02/18/2011   Past Surgical History:  Procedure Laterality Date   bone spur removed from left foot  2006   COLONOSCOPY  2007   EYE SURGERY     both eyes   HYSTEROSCOPY WITH D & C  02/18/2011   Procedure: DILATATION AND CURETTAGE (D&C) /HYSTEROSCOPY;  Surgeon: Frederico Hamman, MD;  Location: [facility];  Service: Gynecology;  Laterality: N/A;   ROBOTIC ASSISTED LAP VAGINAL HYSTERECTOMY  03/31/11   hyst with SBO due to endometrial cancer   Family History  Problem Relation Age of Onset   Hyperlipidemia Mother    Hypertension Mother    Cancer Father 23       lung cancer   Hypertension Brother    Breast cancer Neg Hx    Review of Systems  Constitutional: Negative.   HENT: Negative.    Eyes: Negative.   Respiratory:  Negative for cough, chest tightness and shortness of breath.   Cardiovascular:  Negative for chest pain, palpitations and leg swelling.  Gastrointestinal:  Negative for abdominal distention, abdominal pain, constipation, diarrhea, nausea and vomiting.  Musculoskeletal: Negative.   Skin: Negative.   Neurological: Negative.   Psychiatric/Behavioral: Negative.      Objective:  Physical Exam Constitutional:  Appearance: She is well-developed.  HENT:     Head: Normocephalic and atraumatic.  Cardiovascular:     Rate and Rhythm: Normal rate and regular rhythm.  Pulmonary:     Effort: Pulmonary effort is normal. No respiratory distress.     Breath sounds: Normal breath sounds. No wheezing or rales.  Abdominal:     General: Bowel sounds are normal. There is no distension.     Palpations: Abdomen is soft.     Tenderness: There is no abdominal tenderness. There is no rebound.  Musculoskeletal:     Cervical back: Normal range of motion.  Skin:    General: Skin is warm and dry.  Neurological:     Mental Status: She is alert and oriented to person, place, and time.      Coordination: Coordination normal.     Vitals:   10/05/22 0945  BP: 138/80  Pulse: 70  Temp: 98.3 F (36.8 C)  TempSrc: Oral  SpO2: 99%  Weight: 168 lb 2 oz (76.3 kg)  Height: '5\' 4"'$  (1.626 m)    Assessment & Plan:

## 2022-10-05 NOTE — Patient Instructions (Signed)
Think about the shingles vaccine at the pharmacy

## 2022-10-05 NOTE — Assessment & Plan Note (Signed)
Seeing gyn yearly and no clinical signs of recurrence.

## 2022-10-05 NOTE — Assessment & Plan Note (Signed)
Checking HgA1c and adjust as needed.  

## 2022-10-05 NOTE — Assessment & Plan Note (Signed)
BP at goal on lisinopril/hctz 20/25 mg daily. Checking CMP, CBC, lipid panel and adjust as needed.

## 2022-10-05 NOTE — Assessment & Plan Note (Signed)
Flu shot up to date. Covid-19 counseled. Pneumonia complete. Shingrix counseled due at pharmacy. Tetanus due at pharmacy. Colonoscopy due 2028 likely will do cologuard if anything then. Mammogram due 2025, pap smear aged out and dexa complete. Counseled about sun safety and mole surveillance. Counseled about the dangers of distracted driving. Given 10 year screening recommendations.

## 2022-11-02 ENCOUNTER — Other Ambulatory Visit: Payer: Self-pay | Admitting: Internal Medicine

## 2022-11-02 DIAGNOSIS — Z1231 Encounter for screening mammogram for malignant neoplasm of breast: Secondary | ICD-10-CM

## 2022-12-15 ENCOUNTER — Ambulatory Visit
Admission: RE | Admit: 2022-12-15 | Discharge: 2022-12-15 | Disposition: A | Discharge status: Home / Self Care | Payer: Medicare HMO | Source: Ambulatory Visit | Attending: Internal Medicine | Admitting: Internal Medicine

## 2022-12-15 DIAGNOSIS — Z1231 Encounter for screening mammogram for malignant neoplasm of breast: Secondary | ICD-10-CM | POA: Diagnosis not present

## 2022-12-17 ENCOUNTER — Encounter: Payer: Self-pay | Admitting: Internal Medicine

## 2022-12-17 LAB — HM MAMMOGRAPHY

## 2023-07-21 DIAGNOSIS — H5213 Myopia, bilateral: Secondary | ICD-10-CM | POA: Diagnosis not present

## 2023-07-21 DIAGNOSIS — H524 Presbyopia: Secondary | ICD-10-CM | POA: Diagnosis not present

## 2023-08-29 ENCOUNTER — Other Ambulatory Visit: Payer: Self-pay | Admitting: Internal Medicine

## 2023-09-29 ENCOUNTER — Ambulatory Visit (INDEPENDENT_AMBULATORY_CARE_PROVIDER_SITE_OTHER): Payer: Medicare HMO

## 2023-09-29 VITALS — Ht 64.0 in | Wt 168.0 lb

## 2023-09-29 DIAGNOSIS — Z780 Asymptomatic menopausal state: Secondary | ICD-10-CM | POA: Diagnosis not present

## 2023-09-29 DIAGNOSIS — Z0000 Encounter for general adult medical examination without abnormal findings: Secondary | ICD-10-CM

## 2023-09-29 NOTE — Patient Instructions (Signed)
 [name] , Thank you for taking time to come for your Medicare Wellness Visit. I appreciate your ongoing commitment to your health goals. Please review the following plan we discussed and let me know if I can assist you in the future.   Referrals/Orders/Follow-Ups/Clinician Recommendations: It was nice talking to you today.  Each day, aim for 6 glasses of water, plenty of protein in your diet and try to get up and walk/ stretch every hour for 5-10 minutes at a time.    This is a list of the screening recommended for you and due dates:  Health Maintenance  Topic Date Due   Zoster (Shingles) Vaccine (1 of 2) Never done   DTaP/Tdap/Td vaccine (2 - Td or Tdap) 07/14/2022   Flu Shot  03/04/2023   COVID-19 Vaccine (4 - 2024-25 season) 04/04/2023   Medicare Annual Wellness Visit  09/28/2024   Pneumonia Vaccine  Completed   DEXA scan (bone density measurement)  Completed   Hepatitis C Screening  Completed   HPV Vaccine  Aged Out   Colon Cancer Screening  Discontinued    Advanced directives: (Copy Requested) Please bring a copy of your health care power of attorney and living will to the office to be added to your chart at your convenience.  Next Medicare Annual Wellness Visit scheduled for next year: Yes

## 2023-09-29 NOTE — Progress Notes (Signed)
 Subjective:   [name] is a 78 y.o. who presents for a Medicare Wellness preventive visit.  Visit Complete: Virtual I connected with  [name] on 09/29/23 by a audio enabled telemedicine application and verified that I am speaking with the correct person using two identifiers.  Patient Location: Home  Provider Location: Home Office  I discussed the limitations of evaluation and management by telemedicine. The patient expressed understanding and agreed to proceed.  Vital Signs: Because this visit was a virtual/telehealth visit, some criteria may be missing or patient reported. Any vitals not documented were not able to be obtained and vitals that have been documented are patient reported.  VideoDeclined- This patient declined Librarian, academic. Therefore the visit was completed with audio only.  AWV Questionnaire: No: Patient Medicare AWV questionnaire was not completed prior to this visit.  Cardiac Risk Factors include: advanced age (>59men, >36 women)     Objective:    Today's Vitals   09/29/23 1512  Weight: 168 lb (76.2 kg)  Height: 5\' 4"  (1.626 m)   Body mass index is 28.84 kg/m.     09/29/2023    3:17 PM 02/11/2011    9:04 AM  Advanced Directives  Does Patient Have a Medical Advance Directive? No Patient does not have advance directive  Pre-existing out of facility DNR order (yellow form or pink MOST form)  No    Current Medications (verified) Outpatient Encounter Medications as of 09/29/2023  Medication Sig   Fish Oil OIL Take 2 tablets by mouth daily. Sundown Natural chewables   Garlic 500 MG CAPS Take 1 capsule by mouth daily.    ibuprofen (ADVIL,MOTRIN) 200 MG tablet Take 200 mg by mouth 2 (two) times daily. As needed for pain   lisinopril-hydrochlorothiazide (ZESTORETIC) 20-25 MG tablet TAKE 1 TABLET EVERY DAY   Multiple Vitamins-Minerals (CENTRUM SILVER ULTRA WOMENS PO) Take 1 tablet by mouth every morning.   VITAMIN D,  ERGOCALCIFEROL, PO Take by mouth.   No facility-administered encounter medications on file as of 09/29/2023.    Allergies (verified) Codeine   History: Past Medical History:  Diagnosis Date   Bone spur R foot   2014   Cataract    Cervical cancer (HCC)    GERD (gastroesophageal reflux disease)    On no meds   Hypertension    Post-menopausal bleeding 02/18/2011   Past Surgical History:  Procedure Laterality Date   bone spur removed from left foot  2006   COLONOSCOPY  2007   EYE SURGERY     both eyes   HYSTEROSCOPY WITH D & C  02/18/2011   Procedure: DILATATION AND CURETTAGE (D&C) /HYSTEROSCOPY;  Surgeon: Kathreen Cosier, MD;  Location: [facility];  Service: Gynecology;  Laterality: N/A;   ROBOTIC ASSISTED LAP VAGINAL HYSTERECTOMY  03/31/11   hyst with SBO due to endometrial cancer   Family History  Problem Relation Age of Onset   Hyperlipidemia Mother    Hypertension Mother    Cancer Father 32       lung cancer   Hypertension Brother    Breast cancer Neg Hx    Social History   Socioeconomic History   Marital status: Divorced    Spouse name: Not on file   Number of children: 1   Years of education: Not on file   Highest education level: Not on file  Occupational History   Occupation: Part time work  Tobacco Use   Smoking status: Never  Smokeless tobacco: Never  Vaping Use   Vaping status: Never Used  Substance and Sexual Activity   Alcohol use: Not Currently    Alcohol/week: 2.0 standard drinks of alcohol    Types: 2 Cans of beer per week   Drug use: No   Sexual activity: Not Currently    Birth control/protection: Post-menopausal, Surgical  Other Topics Concern   Not on file  Social History Narrative   [place]; remarried [id number] son 1967Work: [employer].      Lives at home, son lives with her.   Social Drivers of Corporate investment banker Strain: Low Risk  (09/29/2023)   Overall Financial  Resource Strain (CARDIA)    Difficulty of Paying Living Expenses: Not hard at all  Food Insecurity: No Food Insecurity (09/29/2023)   Hunger Vital Sign    Worried About Running Out of Food in the Last Year: Never true    Ran Out of Food in the Last Year: Never true  Transportation Needs: No Transportation Needs (09/29/2023)   PRAPARE - Administrator, Civil Service (Medical): No    Lack of Transportation (Non-Medical): No  Physical Activity: Inactive (09/29/2023)   Exercise Vital Sign    Days of Exercise per Week: 0 days    Minutes of Exercise per Session: 0 min  Stress: No Stress Concern Present (09/29/2023)   [employer] of Occupational Health - Occupational Stress Questionnaire    Feeling of Stress : Only a little  Social Connections: Socially Isolated (09/29/2023)   Social Connection and Isolation Panel [NHANES]    Frequency of Communication with Friends and Family: More than three times a week    Frequency of Social Gatherings with Friends and Family: Twice a week    Attends Religious Services: Never    Database administrator or Organizations: No    Attends Engineer, structural: Never    Marital Status: Divorced    Tobacco Counseling Counseling given: Not Answered    Clinical Intake:  Pre-visit preparation completed: Yes  Pain : No/denies pain     BMI - recorded: 28.84 Nutritional Status: BMI 25 -29 Overweight Nutritional Risks: None Diabetes: No  How often do you need to have someone help you when you read instructions, pamphlets, or other written materials from your doctor or pharmacy?: 1 - Never  Interpreter Needed?: No  Information entered by :: Desirae Mancusi, RMA   Activities of Daily Living     09/29/2023    3:14 PM  In your present state of health, do you have any difficulty performing the following activities:  Hearing? 0  Vision? 0  Difficulty concentrating or making decisions? 0  Walking or climbing stairs? 0  Dressing or  bathing? 0  Doing errands, shopping? 0  Preparing Food and eating ? N  Using the Toilet? N  In the past six months, have you accidently leaked urine? N  Do you have problems with loss of bowel control? N  Managing your Medications? N  Managing your Finances? N  Housekeeping or managing your Housekeeping? N    Patient Care Team: Myrlene Broker, MD as PCP - General (Internal Medicine) Willodean Rosenthal, MD (Obstetrics and Gynecology)  Indicate any recent Medical Services you may have received from other than [facility] providers in the past year (date may be approximate).     Assessment:   This is a routine wellness examination for [name].  Hearing/Vision screen Hearing Screening -  Comments:: Denies hearing difficulties   Vision Screening - Comments:: Wears eyeglasses for reading   Goals Addressed               This Visit's Progress     Patient Stated (pt-stated)        Stay healthy by eating healthier       Depression Screen     09/29/2023    3:23 PM 10/05/2022    9:48 AM 08/31/2022    8:28 AM 10/02/2021   10:01 AM 09/26/2020    9:24 AM 09/20/2019    9:52 AM 09/12/2018    9:14 AM  PHQ 2/9 Scores  PHQ - 2 Score 0 0 1 0 0 0 0  PHQ- 9 Score 1 0 2        Fall Risk     09/29/2023    3:18 PM 10/05/2022    9:48 AM 10/02/2021   10:02 AM 09/26/2020    9:24 AM 09/20/2019    9:51 AM  Fall Risk   Falls in the past year? 0 0 0 0 0  Number falls in past yr: 0 0 0 0   Injury with Fall? 0 0 0 0   Risk for fall due to : No Fall Risks      Follow up Falls prevention discussed;Falls evaluation completed Falls evaluation completed       MEDICARE RISK AT HOME:  Medicare Risk at Home Any stairs in or around the home?: No Home free of loose throw rugs in walkways, pet beds, electrical cords, etc?: Yes (has some rugs) Adequate lighting in your home to reduce risk of falls?: Yes Use of a cane, walker or w/c?: No Grab bars in the bathroom?: Yes Shower chair or bench in  shower?: No Elevated toilet seat or a handicapped toilet?: No  TIMED UP AND GO:  Was the test performed?  No  Cognitive Function: 6CIT completed        09/29/2023    3:18 PM  6CIT Screen  What Year? 0 points  What month? 0 points  What time? 0 points  Count back from 20 0 points  Months in reverse 0 points  Repeat phrase 2 points  Total Score 2 points    Immunizations Immunization History  Administered Date(s) Administered   Fluad Quad(high Dose 65+) 05/18/2019, 06/13/2020   Influenza Whole 05/07/2012   Influenza, High Dose Seasonal PF 05/28/2016, 06/25/2018, 04/15/2022   Influenza,inj,Quad PF,6+ Mos 05/07/2014   Influenza-Unspecified 05/06/2013, 06/23/2015, 05/06/2017, 05/28/2021   Moderna SARS-COV2 Booster Vaccination 09/12/2020   Moderna Sars-Covid-2 Vaccination 09/05/2019, 10/03/2019   Pneumococcal Conjugate-13 07/19/2013   Pneumococcal Polysaccharide-23 07/14/2012   Tdap 07/14/2012    Screening Tests Health Maintenance  Topic Date Due   Zoster Vaccines- Shingrix (1 of 2) Never done   DTaP/Tdap/Td (2 - Td or Tdap) 07/14/2022   INFLUENZA VACCINE  03/04/2023   COVID-19 Vaccine (4 - 2024-25 season) 04/04/2023   Medicare Annual Wellness (AWV)  10/05/2023   Pneumonia Vaccine 53+ Years old  Completed   DEXA SCAN  Completed   Hepatitis C Screening  Completed   HPV VACCINES  Aged Out   Colonoscopy  Discontinued    Health Maintenance  Health Maintenance Due  Topic Date Due   Zoster Vaccines- Shingrix (1 of 2) Never done   DTaP/Tdap/Td (2 - Td or Tdap) 07/14/2022   INFLUENZA VACCINE  03/04/2023   COVID-19 Vaccine (4 - 2024-25 season) 04/04/2023   Medicare Annual Wellness (AWV)  10/05/2023  Health Maintenance Items Addressed: DEXA ordered, Hepatitis C Screening  Additional Screening:  Vision Screening: Recommended annual ophthalmology exams for early detection of glaucoma and other disorders of the eye.  Dental Screening: Recommended annual dental exams  for proper oral hygiene  Community Resource Referral / Chronic Care Management: CRR required this visit?  No   CCM required this visit?  No     Plan:     I have personally reviewed and noted the following in the patient's chart:   Medical and social history Use of alcohol, tobacco or illicit drugs  Current medications and supplements including opioid prescriptions. Patient is not currently taking opioid prescriptions. Functional ability and status Nutritional status Physical activity Advanced directives List of other physicians Hospitalizations, surgeries, and ER visits in previous 12 months Vitals Screenings to include cognitive, depression, and falls Referrals and appointments  In addition, I have reviewed and discussed with patient certain preventive protocols, quality metrics, and best practice recommendations. A written personalized care plan for preventive services as well as general preventive health recommendations were provided to patient.     Jakerria Kingbird L Nakeda Lebron, CMA   09/29/2023   After Visit Summary: (Mail) Due to this being a telephonic visit, the after visit summary with patients personalized plan was offered to patient via mail   Notes: Please refer to Routing Comments.

## 2023-10-12 ENCOUNTER — Ambulatory Visit (INDEPENDENT_AMBULATORY_CARE_PROVIDER_SITE_OTHER): Payer: Medicare HMO | Admitting: Internal Medicine

## 2023-10-12 ENCOUNTER — Encounter: Payer: Self-pay | Admitting: Internal Medicine

## 2023-10-12 VITALS — BP 138/82 | HR 78 | Temp 97.3°F | Ht 60.0 in | Wt 168.0 lb

## 2023-10-12 DIAGNOSIS — R7303 Prediabetes: Secondary | ICD-10-CM | POA: Diagnosis not present

## 2023-10-12 DIAGNOSIS — Z0000 Encounter for general adult medical examination without abnormal findings: Secondary | ICD-10-CM | POA: Diagnosis not present

## 2023-10-12 DIAGNOSIS — I10 Essential (primary) hypertension: Secondary | ICD-10-CM | POA: Diagnosis not present

## 2023-10-12 DIAGNOSIS — Z8542 Personal history of malignant neoplasm of other parts of uterus: Secondary | ICD-10-CM | POA: Diagnosis not present

## 2023-10-12 LAB — LIPID PANEL
Cholesterol: 192 mg/dL (ref 0–200)
HDL: 79.4 mg/dL (ref >39.00)
LDL Cholesterol: 101 mg/dL — ABNORMAL HIGH (ref 0–99)
NonHDL: 112.57
Total CHOL/HDL Ratio: 2
Triglycerides: 59 mg/dL (ref 0.0–149.0)
VLDL: 11.8 mg/dL (ref 0.0–40.0)

## 2023-10-12 LAB — COMPREHENSIVE METABOLIC PANEL
ALT: 11 U/L (ref 0–35)
AST: 20 U/L (ref 0–37)
Albumin: 4.2 g/dL (ref 3.5–5.2)
Alkaline Phosphatase: 46 U/L (ref 39–117)
BUN: 15 mg/dL (ref 6–23)
CO2: 28 meq/L (ref 19–32)
Calcium: 10.1 mg/dL (ref 8.4–10.5)
Chloride: 103 meq/L (ref 96–112)
Creatinine, Ser: 0.93 mg/dL (ref 0.40–1.20)
GFR: 59.56 mL/min — ABNORMAL LOW (ref >60.00)
Glucose, Bld: 102 mg/dL — ABNORMAL HIGH (ref 70–99)
Potassium: 4.6 meq/L (ref 3.5–5.1)
Sodium: 137 meq/L (ref 135–145)
Total Bilirubin: 0.4 mg/dL (ref 0.2–1.2)
Total Protein: 7.7 g/dL (ref 6.0–8.3)

## 2023-10-12 LAB — CBC
HCT: 40.1 % (ref 36.0–46.0)
Hemoglobin: 13.3 g/dL (ref 12.0–15.0)
MCHC: 33.3 g/dL (ref 30.0–36.0)
MCV: 92.9 fl (ref 78.0–100.0)
Platelets: 216 10*3/uL (ref 150.0–400.0)
RBC: 4.31 Mil/uL (ref 3.87–5.11)
RDW: 13.9 % (ref 11.5–15.5)
WBC: 4.1 10*3/uL (ref 4.0–10.5)

## 2023-10-12 LAB — HEMOGLOBIN A1C: Hgb A1c MFr Bld: 6.1 % (ref 4.6–6.5)

## 2023-10-12 NOTE — Progress Notes (Unsigned)
   Subjective:   Patient ID: [name], female    DOB: 1946/01/15, 78 y.o.   MRN: [id number]  HPI The patient is here for physical.  PMH, [facility], social history reviewed and updated  Review of Systems  Constitutional: Negative.   HENT: Negative.    Eyes: Negative.   Respiratory:  Negative for cough, chest tightness and shortness of breath.   Cardiovascular:  Negative for chest pain, palpitations and leg swelling.  Gastrointestinal:  Negative for abdominal distention, abdominal pain, constipation, diarrhea, nausea and vomiting.  Musculoskeletal: Negative.   Skin: Negative.   Neurological: Negative.   Psychiatric/Behavioral: Negative.      Objective:  Physical Exam Constitutional:      Appearance: She is well-developed.  HENT:     Head: Normocephalic and atraumatic.  Cardiovascular:     Rate and Rhythm: Normal rate and regular rhythm.  Pulmonary:     Effort: Pulmonary effort is normal. No respiratory distress.     Breath sounds: Normal breath sounds. No wheezing or rales.  Abdominal:     General: Bowel sounds are normal. There is no distension.     Palpations: Abdomen is soft.     Tenderness: There is no abdominal tenderness. There is no rebound.  Musculoskeletal:     Cervical back: Normal range of motion.  Skin:    General: Skin is warm and dry.  Neurological:     Mental Status: She is alert and oriented to person, place, and time.     Coordination: Coordination normal.     Vitals:   10/12/23 0949  BP: 138/82  Pulse: 78  Temp: (!) 97.3 F (36.3 C)  TempSrc: Oral  SpO2: 98%  Weight: 168 lb (76.2 kg)  Height: 5' (1.524 m)   EKG: Rate 61, axis normal, interval normal, sinus, no st or t wave changes, no significant change compared to prior 2012  Assessment & Plan:

## 2023-10-12 NOTE — Patient Instructions (Signed)
 The EKG is normal today.  Think about the tetanus shot and shingles vaccine at the pharmacy

## 2023-10-15 NOTE — Assessment & Plan Note (Signed)
 Checking HGA1c and adjust as needed.

## 2023-10-15 NOTE — Assessment & Plan Note (Signed)
 BP at goal on lisinopril/hydrochlorothiazide 20/25 checking CMP and CBC. Adjust as needed.

## 2023-10-15 NOTE — Progress Notes (Signed)
Unable to leave voicemail 1st attempt

## 2023-10-15 NOTE — Assessment & Plan Note (Signed)
 Seeing gyn yearly.

## 2023-10-15 NOTE — Assessment & Plan Note (Signed)
 Flu shot up to date. Pneumonia complete. Shingrix counseled due at pharmacy. Tetanus due at pharmacy. Colonoscopy up to date. Mammogram up to date, pap smear aged out and dexa complete. Counseled about sun safety and mole surveillance. Counseled about the dangers of distracted driving. Given 10 year screening recommendations.

## 2023-11-09 ENCOUNTER — Other Ambulatory Visit: Payer: Self-pay | Admitting: Internal Medicine

## 2023-11-09 ENCOUNTER — Ambulatory Visit (INDEPENDENT_AMBULATORY_CARE_PROVIDER_SITE_OTHER)
Admission: RE | Admit: 2023-11-09 | Discharge: 2023-11-09 | Disposition: A | Discharge status: Home / Self Care | Source: Ambulatory Visit | Attending: Family Medicine | Admitting: Family Medicine

## 2023-11-09 DIAGNOSIS — Z780 Asymptomatic menopausal state: Secondary | ICD-10-CM

## 2023-11-09 DIAGNOSIS — Z1231 Encounter for screening mammogram for malignant neoplasm of breast: Secondary | ICD-10-CM

## 2023-11-10 NOTE — Progress Notes (Signed)
 Forwarding to you. I signed off on order apparently. Thanks.

## 2023-11-11 ENCOUNTER — Encounter: Payer: Self-pay | Admitting: Internal Medicine

## 2023-11-11 ENCOUNTER — Other Ambulatory Visit: Payer: Self-pay | Admitting: Internal Medicine

## 2023-11-11 LAB — HM DEXA SCAN: HM Dexa Scan: -0.9

## 2023-11-15 ENCOUNTER — Other Ambulatory Visit: Payer: Self-pay | Admitting: Internal Medicine

## 2023-11-15 NOTE — Telephone Encounter (Signed)
 Copied from CRM [id number]. Topic: Clinical - Medication Refill >> Nov 15, 2023 11:03 AM Orien Bird wrote: Most Recent Primary Care Visit:  Provider: Bambi Lever A  Department: [facility]  Visit Type: PHYSICAL  Date: 10/12/2023  Medication: lisinopril-hydrochlorothiazide (ZESTORETIC) 20-25 MG tablet  Has the patient contacted their pharmacy? Yes (Agent: If no, request that the patient contact the pharmacy for the refill. If patient does not wish to contact the pharmacy document the reason why and proceed with request.) (Agent: If yes, when and what did the pharmacy advise?)  Is this the correct pharmacy for this prescription? Yes If no, delete pharmacy and type the correct one.  This is the patient's preferred pharmacy:  [facility] - [place], [place] - [address] [place] [address] Phone: [phone] Fax: [phone]   Has the prescription been filled recently? Yes  Is the patient out of the medication? No  Has the patient been seen for an appointment in the last year OR does the patient have an upcoming appointment? Yes  Can we respond through MyChart? No  Agent: Please be advised that Rx refills may take up to 3 business days. We ask that you follow-up with your pharmacy.

## 2023-12-16 ENCOUNTER — Ambulatory Visit
Admission: RE | Admit: 2023-12-16 | Discharge: 2023-12-16 | Disposition: A | Discharge status: Home / Self Care | Source: Ambulatory Visit | Attending: Internal Medicine | Admitting: Internal Medicine

## 2023-12-16 DIAGNOSIS — Z1231 Encounter for screening mammogram for malignant neoplasm of breast: Secondary | ICD-10-CM

## 2023-12-21 ENCOUNTER — Ambulatory Visit: Payer: Self-pay | Admitting: Internal Medicine

## 2023-12-21 LAB — HM MAMMOGRAPHY

## 2024-01-22 IMAGING — MG MM DIGITAL SCREENING BILAT W/ TOMO AND CAD
8 series · 8 of 24 positions shown · non-contrast
Comparison: Previous exam(s).

CLINICAL DATA: Screening.

EXAM:
DIGITAL SCREENING BILATERAL MAMMOGRAM WITH TOMOSYNTHESIS AND CAD
TECHNIQUE: Bilateral screening digital craniocaudal and mediolateral oblique
mammograms were obtained. Bilateral screening digital breast
tomosynthesis was performed. The images were evaluated with
computer-aided detection.

[L CC synth-2D]
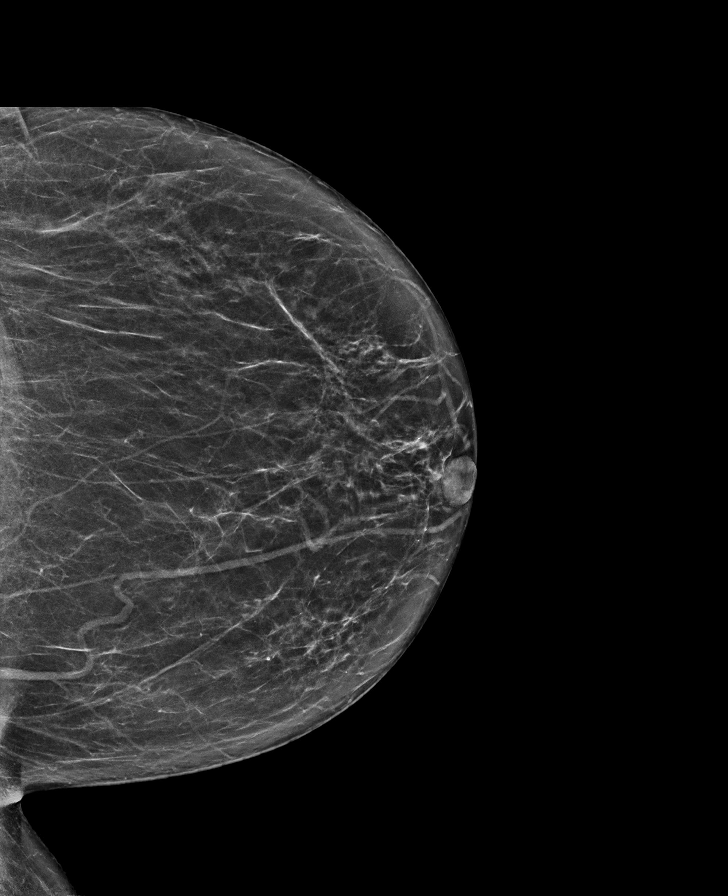

[L MLO synth-2D]
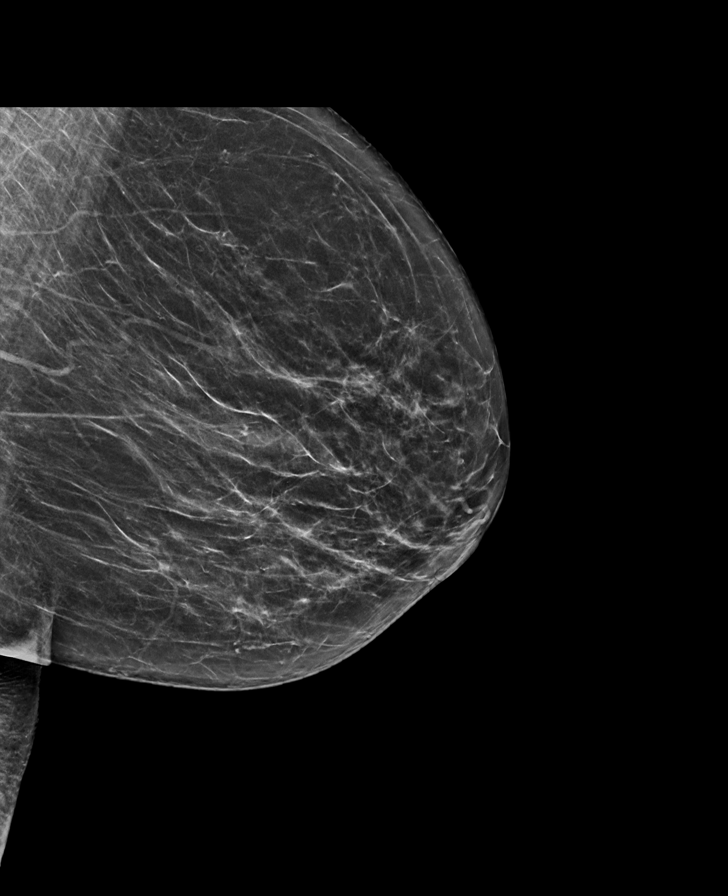

[R MLO synth-2D]
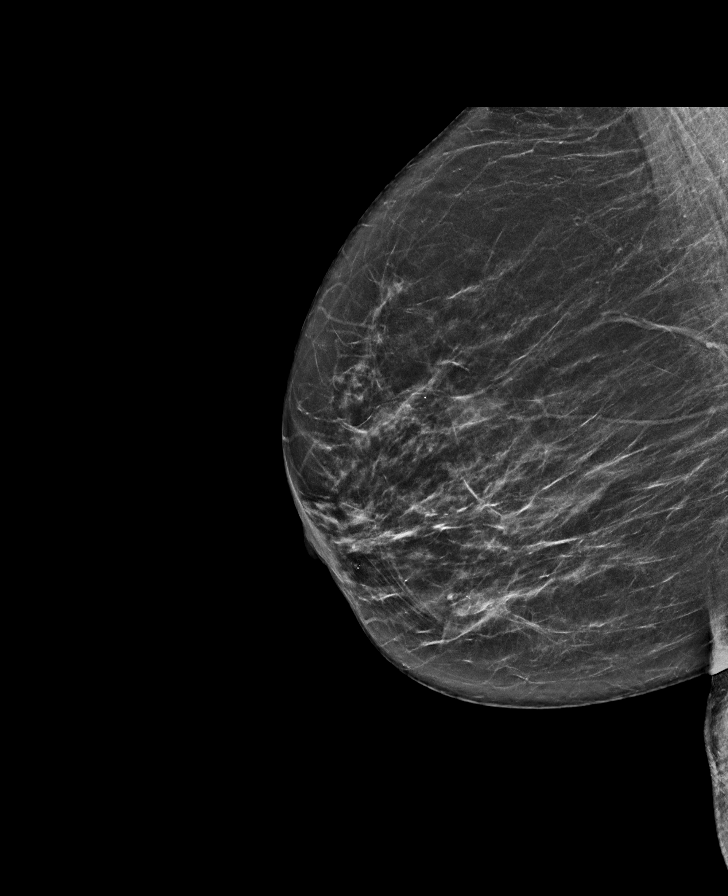

[R CC synth-2D]
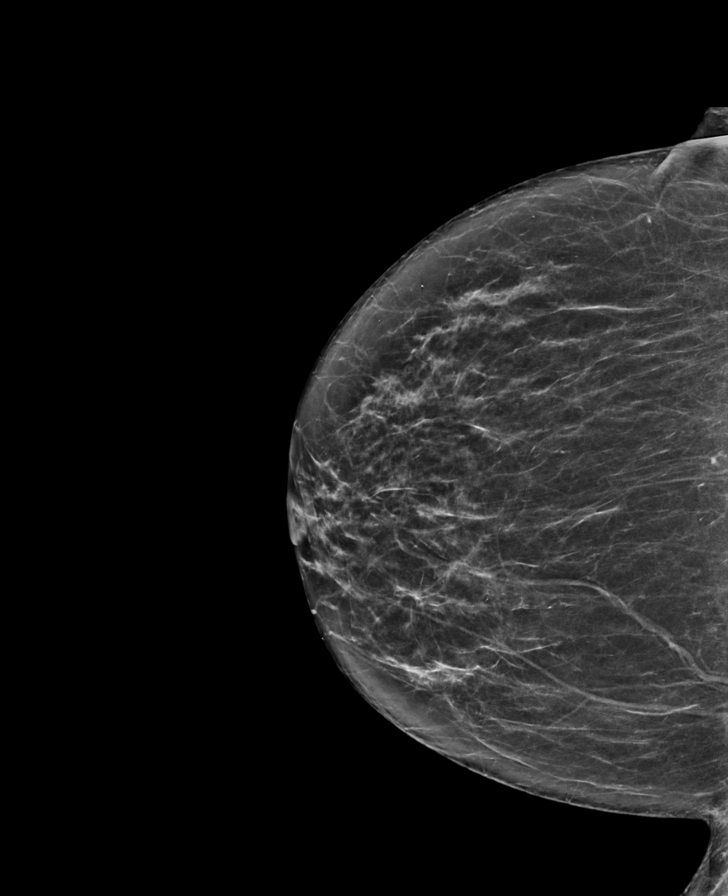

[R MLO tomo · tomo slice 34/67.0]
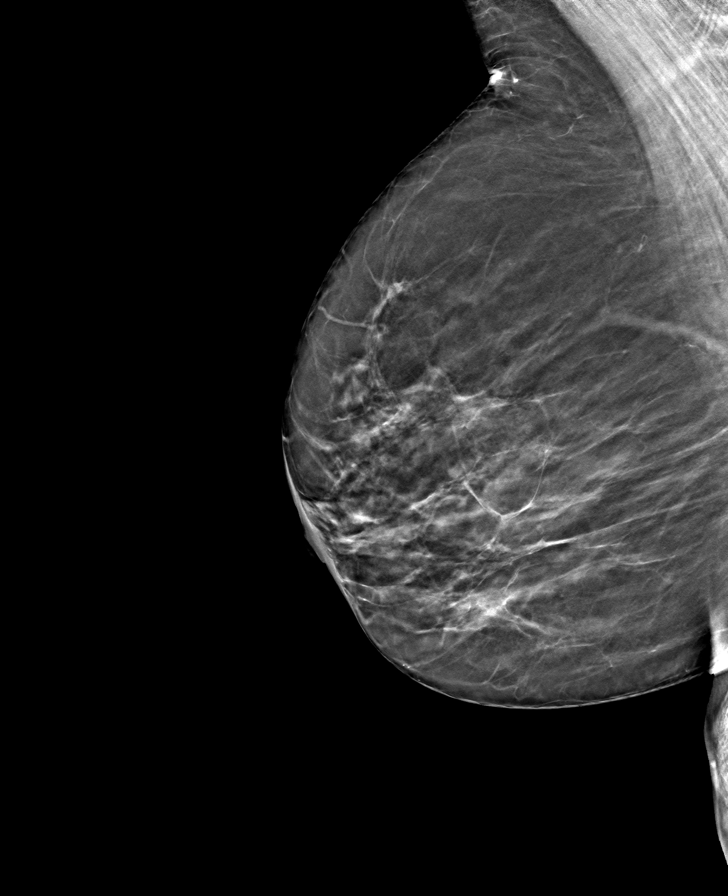

[L CC tomo · tomo slice 33/66.0]
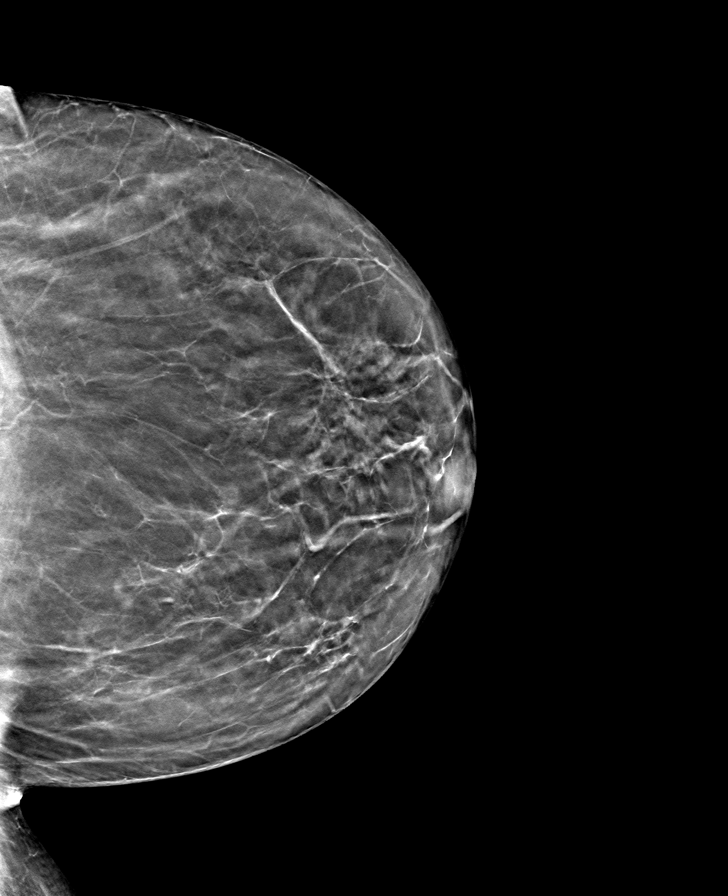

[R CC tomo · tomo slice 33/66.0]
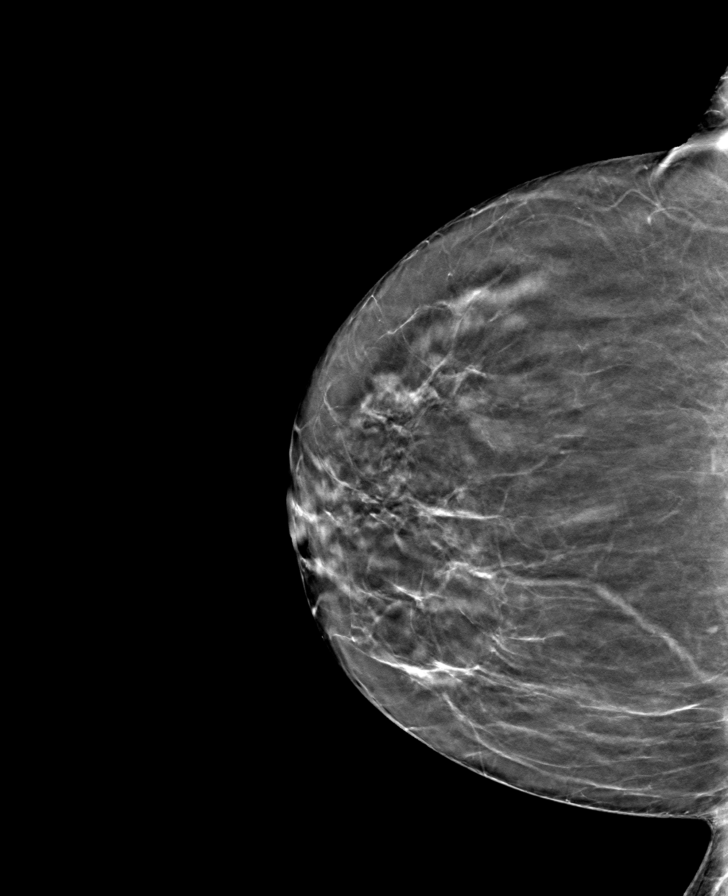

[L MLO tomo · tomo slice 35/69.0]
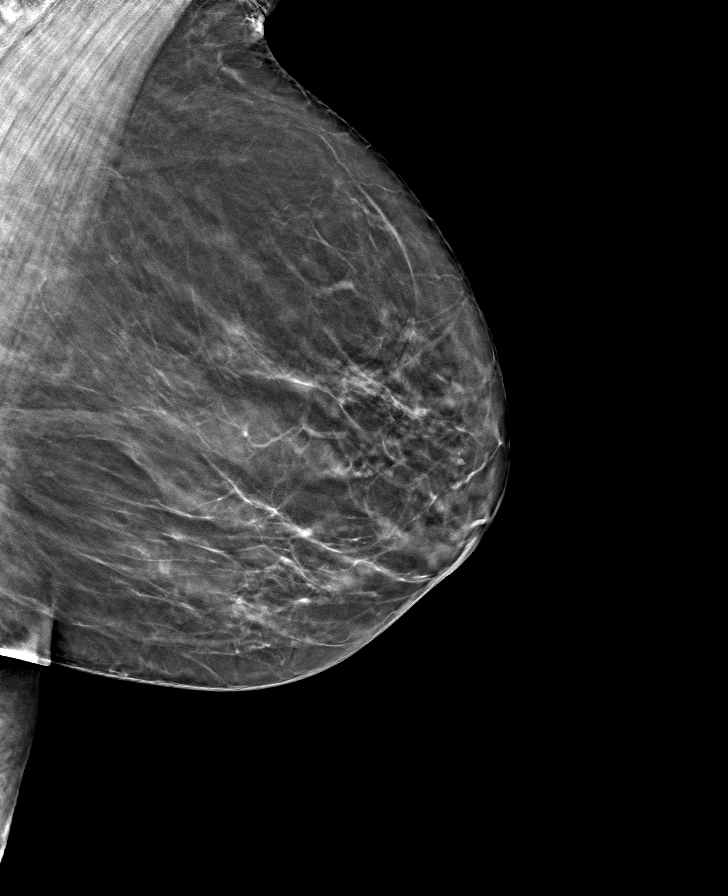

[8 of 24 positions shown; findings below may reference images not displayed]

ACR Breast Density Category b: There are scattered areas of
fibroglandular density.
FINDINGS: There are no findings suspicious for malignancy.
IMPRESSION: No mammographic evidence of malignancy. A result letter of this
screening mammogram will be mailed directly to the patient.

RECOMMENDATION:
Screening mammogram in one year. (Code:51-O-LD2)

BI-RADS CATEGORY  1: Negative.

## 2024-04-24 ENCOUNTER — Encounter: Payer: Self-pay | Admitting: Internal Medicine

## 2024-04-24 ENCOUNTER — Ambulatory Visit: Admitting: Internal Medicine

## 2024-04-24 VITALS — BP 116/80 | HR 68 | Temp 98.2°F | Wt 159.0 lb

## 2024-04-24 DIAGNOSIS — Z23 Encounter for immunization: Secondary | ICD-10-CM

## 2024-04-24 DIAGNOSIS — R7303 Prediabetes: Secondary | ICD-10-CM | POA: Diagnosis not present

## 2024-04-24 LAB — POCT GLYCOSYLATED HEMOGLOBIN (HGB A1C): HbA1c POC (<> result, manual entry): 5.7 % (ref 4.0–5.6)

## 2024-04-24 NOTE — Progress Notes (Signed)
   Subjective:   Patient ID: [name], female    DOB: [birth year], 78 y.o.   MRN: [id number]  Discussed the use of AI scribe software for clinical note transcription with the patient, who gave verbal consent to proceed.  History of Present Illness [name] is a 78 year old female who presents for follow-up on her blood sugar levels.  Since being informed of her elevated hemoglobin A1c of 6.1 in March, she has been actively managing her diet by reducing sweets, carbohydrates, and red meat, and incorporating more baked greens. Her most recent hemoglobin A1c is 5.7, decreased from 6.1 in March.  She has experienced a weight loss of approximately ten pounds since March, which she attributes to her dietary changes. Her clothes are becoming loose, indicating significant weight loss. She is concerned about losing weight too quickly.  Her family history is significant for diabetes, as her grandmother had the condition, and her son currently has it. She is cautious about her carbohydrate and sugar intake to prevent the development of diabetes.  Review of Systems  Constitutional: Negative.   HENT: Negative.    Eyes: Negative.   Respiratory:  Negative for cough, chest tightness and shortness of breath.   Cardiovascular:  Negative for chest pain, palpitations and leg swelling.  Gastrointestinal:  Negative for abdominal distention, abdominal pain, constipation, diarrhea, nausea and vomiting.  Musculoskeletal: Negative.   Skin: Negative.   Neurological: Negative.   Psychiatric/Behavioral: Negative.      Objective:  Physical Exam Constitutional:      Appearance: She is well-developed.  HENT:     Head: Normocephalic and atraumatic.  Cardiovascular:     Rate and Rhythm: Normal rate and regular rhythm.  Pulmonary:     Effort: Pulmonary effort is normal. No respiratory distress.     Breath sounds: Normal breath sounds. No wheezing or rales.  Abdominal:     General: Bowel sounds are  normal. There is no distension.     Palpations: Abdomen is soft.     Tenderness: There is no abdominal tenderness.  Musculoskeletal:     Cervical back: Normal range of motion.  Skin:    General: Skin is warm and dry.  Neurological:     Mental Status: She is alert and oriented to person, place, and time.     Coordination: Coordination normal.     Vitals:   04/24/24 0929  BP: 116/80  Pulse: 68  Temp: 98.2 F (36.8 C)  TempSrc: Oral  SpO2: 97%  Weight: 159 lb (72.1 kg)    Assessment and Plan Assessment & Plan Prediabetes  Prediabetes has improved with an HbA1c of 5.7, attributed to dietary changes and weight loss. Genetic predisposition is noted. Continue dietary regimen with portion control and reduced sugars/carbohydrates. Recheck HbA1c during spring physical. Maintain lifestyle modifications to mitigate genetic risk.  General Health Maintenance   She received a flu vaccination.

## 2024-04-24 NOTE — Patient Instructions (Signed)
 Your HgA1c today is 5.7 which is back in the normal range. 5.7 and less is normal. 5.8 to 6.4 is considered pre-diabetes and last time in March yours was 6.1.

## 2024-04-24 NOTE — Assessment & Plan Note (Signed)
 Prediabetes has improved with an HbA1c of 5.7 done today point of care, attributed to dietary changes and weight loss. Genetic predisposition is noted. Continue dietary regimen with portion control and reduced sugars/carbohydrates. Recheck HbA1c during spring physical. Maintain lifestyle modifications to mitigate genetic risk.

## 2024-08-30 ENCOUNTER — Other Ambulatory Visit: Payer: Self-pay | Admitting: Internal Medicine

## 2024-09-28 ENCOUNTER — Ambulatory Visit

## 2024-09-29 ENCOUNTER — Ambulatory Visit: Payer: Medicare HMO

## 2025-02-27 ENCOUNTER — Ambulatory Visit
